# Patient Record
Sex: Male | Born: 1960 | Race: White | Hispanic: No | Marital: Married | Smoking: Former smoker
Health system: Southern US, Community
[De-identification: ages and names within clinical notes are randomized; demographics above are authoritative.]

## PROBLEM LIST (undated history)

## (undated) DIAGNOSIS — K635 Polyp of colon: Secondary | ICD-10-CM

## (undated) DIAGNOSIS — IMO0001 Reserved for inherently not codable concepts without codable children: Secondary | ICD-10-CM

## (undated) HISTORY — DX: Polyp of colon: K63.5

## (undated) HISTORY — PX: COLON SURGERY: SHX602

---

## 2011-05-25 HISTORY — PX: OTHER SURGICAL HISTORY: SHX169

## 2011-06-16 ENCOUNTER — Encounter (INDEPENDENT_AMBULATORY_CARE_PROVIDER_SITE_OTHER): Payer: Self-pay

## 2011-06-17 ENCOUNTER — Encounter (INDEPENDENT_AMBULATORY_CARE_PROVIDER_SITE_OTHER): Payer: Self-pay | Admitting: General Surgery

## 2011-06-17 ENCOUNTER — Ambulatory Visit (INDEPENDENT_AMBULATORY_CARE_PROVIDER_SITE_OTHER): Payer: BC Managed Care – PPO | Admitting: General Surgery

## 2011-06-17 VITALS — BP 120/82 | HR 68 | Temp 98.2°F | Resp 16 | Ht 71.5 in | Wt 196.4 lb

## 2011-06-17 DIAGNOSIS — K6389 Other specified diseases of intestine: Secondary | ICD-10-CM | POA: Insufficient documentation

## 2011-06-17 NOTE — Progress Notes (Signed)
Subjective:   Colon mass  Patient ID: Chris Erickson, male   DOB: 02/18/1961, 51 y.o.   MRN: [id number]  HPI Patient is a 51-year-old male referred  through the courtesy of Dr. Ganem due to a recent abnormal colonoscopy. This was essentially a routine screening colonoscopy due to reaching age 51. He does have a family history of colon cancer in his brother that developed before age 60. On questioning he had been having an occasional small amount of red blood with his bowel movements for some months. He has not had any other change in his bowel habits, abdominal pain or bloating, weight loss, or any constitutional symptoms.  I have reviewed the colonoscopy results. The chief finding was an approximately 5 cm multilobulated polypoid lesion described in the distal transverse colon. This was biopsied but not removed and it was tattooed and above and below. Several other small benign appearing polyps were removed from the proximal descending colon, proximal descending colon and one in the rectum and one in the sigmoid colon. All were retrieved and sent for pathology.   The pathology report is not final but I has a verbal report. 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 However the 2 polyps that were removed from the proximal descending colon showed high-grade dysplasia. The other polyps from the right colon and sigmoid and rectum were benign. I spoke personally with Dr. Ganem who was concerned that the large lesion appeared malignant.  Past Medical History  Diagnosis Date  . Colon polyp    History reviewed. No pertinent past surgical history. Current Outpatient Prescriptions  Medication Sig Dispense Refill  . guaiFENesin (MUCINEX) 600 MG 12 hr tablet Take 1,200 mg by mouth 2 (two) times daily.       No Known Allergies History   Social History  . Marital Status: Married    Spouse Name: N/A    Number of Children: N/A  . Years of Education:  N/A   Occupational History  . Not on file.   Social History Main Topics  . Smoking status: Former Smoker    Quit date: 05/25/1995  . Smokeless tobacco: Not on file  . 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 Drug Use: No  . Sexually Active: Not on file   Other Topics Concern  . Not on file   Social History Narrative  . No narrative on file    Review of Systems  Constitutional: Negative.   HENT: Negative.   Respiratory: Negative.   Cardiovascular: Negative.   Gastrointestinal: Positive for blood in stool. Negative for nausea, vomiting, abdominal pain, diarrhea, constipation and abdominal distention.  Musculoskeletal: Negative.        Objective:   Physical Exam General: Alert, well-developed Caucasian male, in no distress Skin: Warm and dry without rash or infection. HEENT: No palpable masses or thyromegaly. Sclera nonicteric. Pupils equal round and reactive. Oropharynx clear. Lymph nodes: No cervical, supraclavicular, or inguinal nodes palpable. Lungs: Breath sounds clear and equal without increased work of breathing Cardiovascular: Regular rate and rhythm without murmur. No JVD or edema. Peripheral pulses intact. Abdomen: Nondistended. Soft and nontender. No masses palpable. No organomegaly. No palpable hernias. Extremities: No edema or joint swelling or deformity. No chronic venous stasis changes. Neurologic: Alert and fully oriented. Gait normal.     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 I feel that the patient would be best served with a subtotal colectomy to address his current problems and future risk. I have discussed this with Dr. Ganem who agrees that the  patient needs a subtotal colectomy. This was discussed in detail with the patient and his wife. I would plan to approach this laparoscopically. We discussed the nature of the procedure and risks of bleeding, infection, anastomotic leak, possible need for temporary ostomy. We discussed that his bowel movements would be much more frequent and loose. Indications for the surgery reviewed and all their questions were answered and he were given literature.    Plan:     Laparoscopic subtotal colectomy with AM admission and bowel prep at home.      

## 2011-06-17 NOTE — Progress Notes (Signed)
Addended by: Glenna Fellows T on: 06/17/2011 11:30 AM   Modules accepted: Orders

## 2011-06-21 ENCOUNTER — Encounter (HOSPITAL_COMMUNITY): Payer: Self-pay

## 2011-06-22 ENCOUNTER — Telehealth (INDEPENDENT_AMBULATORY_CARE_PROVIDER_SITE_OTHER): Payer: Self-pay

## 2011-06-22 NOTE — Telephone Encounter (Signed)
Alvino Chapel (spouse) called to report that Mr. Duve having a lot of pain since his colonoscopy w/biopsy, she also reports that he has blacked out on two different occasions . She denies any abdominal swelling or bloating, nausea/vomiting, no bloody discharge or bloody stools.  She states he has had a decrease in his appetite.  I strongly advised her to call and report this to Dr. Evette Cristal.

## 2011-06-28 ENCOUNTER — Ambulatory Visit (INDEPENDENT_AMBULATORY_CARE_PROVIDER_SITE_OTHER): Payer: Self-pay | Admitting: General Surgery

## 2011-06-28 ENCOUNTER — Encounter (HOSPITAL_COMMUNITY)
Admission: RE | Admit: 2011-06-28 | Discharge: 2011-06-28 | Disposition: A | Discharge status: Home / Self Care | Payer: BC Managed Care – PPO | Source: Ambulatory Visit | Attending: General Surgery | Admitting: General Surgery

## 2011-06-28 ENCOUNTER — Encounter (HOSPITAL_COMMUNITY): Payer: Self-pay

## 2011-06-28 HISTORY — DX: Polyp of colon: K63.5

## 2011-06-28 HISTORY — DX: Reserved for inherently not codable concepts without codable children: IMO0001

## 2011-06-28 LAB — CBC
Hemoglobin: 14.6 g/dL (ref 13.0–17.0)
MCH: 30.2 pg (ref 26.0–34.0)
MCHC: 34.7 g/dL (ref 30.0–36.0)
RDW: 12.7 % (ref 11.5–15.5)

## 2011-06-28 LAB — SURGICAL PCR SCREEN
MRSA, PCR: NEGATIVE
Staphylococcus aureus: NEGATIVE

## 2011-06-28 NOTE — Patient Instructions (Signed)
20 Chris Erickson  06/28/2011   Your procedure is scheduled on:  07-06-2011  Report to [facility] at 1030 AM.  Call this number if you have problems the morning of surgery: [phone]   Remember:follow bowel pre instructions from dr Johna Sheriff   Do not eat food or drink liquids:After Midnight.      Take these medicines the morning of surgery with A SIP OF WATER: no meds to take   Do not wear jewelry..  Do not wear lotions, powders, or perfumes Do not wear deodorant.    Do not bring valuables to the hospital.  Contacts, dentures or bridgework may not be worn into surgery.  Leave suitcase in the car. After surgery it may be brought to your room.  For patients admitted to the hospital, checkout time is 11:00 AM the day of discharge.   special Instructions: CHG Shower Use Special Wash: 1/2 bottle night before surgery and 1/2 bottle morning of surgery.neck down avoid private area   Please read over the following fact sheets that you were given: MRSA Information, blood fact sheet Jasmine December Eaton Folmar rn [facility] pre op nurse phone number [phone]

## 2011-06-28 NOTE — Pre-Procedure Instructions (Signed)
ekg  05-20-2011 dr fried eagle on chart

## 2011-07-06 ENCOUNTER — Inpatient Hospital Stay (HOSPITAL_COMMUNITY)
Admission: RE | Admit: 2011-07-06 | Discharge: 2011-07-11 | DRG: 148 | Disposition: A | Discharge status: Home / Self Care | Payer: BC Managed Care – PPO | Source: Ambulatory Visit | Attending: General Surgery | Admitting: General Surgery

## 2011-07-06 ENCOUNTER — Other Ambulatory Visit (INDEPENDENT_AMBULATORY_CARE_PROVIDER_SITE_OTHER): Payer: Self-pay | Admitting: General Surgery

## 2011-07-06 ENCOUNTER — Encounter (HOSPITAL_COMMUNITY): Payer: Self-pay | Admitting: *Deleted

## 2011-07-06 ENCOUNTER — Inpatient Hospital Stay (HOSPITAL_COMMUNITY): Payer: BC Managed Care – PPO | Admitting: *Deleted

## 2011-07-06 ENCOUNTER — Encounter (INDEPENDENT_AMBULATORY_CARE_PROVIDER_SITE_OTHER): Payer: Self-pay

## 2011-07-06 ENCOUNTER — Encounter (HOSPITAL_COMMUNITY)
Admission: RE | Disposition: A | Discharge status: Home / Self Care | Payer: Self-pay | Source: Ambulatory Visit | Attending: General Surgery

## 2011-07-06 DIAGNOSIS — C189 Malignant neoplasm of colon, unspecified: Secondary | ICD-10-CM

## 2011-07-06 DIAGNOSIS — Z800 Family history of malignant neoplasm of digestive organs: Secondary | ICD-10-CM

## 2011-07-06 DIAGNOSIS — D126 Benign neoplasm of colon, unspecified: Secondary | ICD-10-CM | POA: Diagnosis present

## 2011-07-06 DIAGNOSIS — C184 Malignant neoplasm of transverse colon: Principal | ICD-10-CM | POA: Clinically undetermined

## 2011-07-06 DIAGNOSIS — K560 Paralytic ileus: Secondary | ICD-10-CM | POA: Diagnosis not present

## 2011-07-06 DIAGNOSIS — Z87891 Personal history of nicotine dependence: Secondary | ICD-10-CM

## 2011-07-06 LAB — TYPE AND SCREEN
ABO/RH(D): A POS
Antibody Screen: NEGATIVE

## 2011-07-06 LAB — CBC
HCT: 38.6 % — ABNORMAL LOW (ref 39.0–52.0)
Hemoglobin: 13.3 g/dL (ref 13.0–17.0)
MCH: 29.9 pg (ref 26.0–34.0)
RBC: 4.45 MIL/uL (ref 4.22–5.81)

## 2011-07-06 SURGERY — LAPAROSCOPIC PARTIAL COLECTOMY
Anesthesia: General | Site: Abdomen | Wound class: Clean Contaminated

## 2011-07-06 MED ORDER — KCL IN DEXTROSE-NACL 20-5-0.9 MEQ/L-%-% IV SOLN
INTRAVENOUS | Status: DC
  Administered 2011-07-06 – 2011-07-08 (×5): via INTRAVENOUS
  Administered 2011-07-09: 1000 mL via INTRAVENOUS
  Administered 2011-07-09: 20:00:00 via INTRAVENOUS
  Filled 2011-07-06 (×12): qty 1000

## 2011-07-06 MED ORDER — HYDROMORPHONE HCL PF 1 MG/ML IJ SOLN
0.2500 mg | INTRAMUSCULAR | Status: DC | PRN
  Administered 2011-07-06 (×3): 0.25 mg via INTRAVENOUS

## 2011-07-06 MED ORDER — PROPOFOL 10 MG/ML IV EMUL
INTRAVENOUS | Status: DC | PRN
  Administered 2011-07-06: 150 mg via INTRAVENOUS

## 2011-07-06 MED ORDER — ONDANSETRON HCL 4 MG PO TABS: 4.0000 mg | ORAL_TABLET | Freq: Four times a day (QID) | ORAL | Status: DC | PRN

## 2011-07-06 MED ORDER — LIDOCAINE HCL (CARDIAC) 20 MG/ML IV SOLN
INTRAVENOUS | Status: DC | PRN
  Administered 2011-07-06: 100 mg via INTRAVENOUS

## 2011-07-06 MED ORDER — FENTANYL CITRATE 0.05 MG/ML IJ SOLN
INTRAMUSCULAR | Status: DC | PRN
  Administered 2011-07-06: 150 ug via INTRAVENOUS
  Administered 2011-07-06 (×2): 50 ug via INTRAVENOUS

## 2011-07-06 MED ORDER — SODIUM CHLORIDE 0.9 % IV SOLN
INTRAVENOUS | Status: AC
  Filled 2011-07-06: qty 1

## 2011-07-06 MED ORDER — OXYCODONE-ACETAMINOPHEN 5-325 MG PO TABS
1.0000 | ORAL_TABLET | ORAL | Status: DC | PRN
Start: 2011-07-06 — End: 2011-07-11

## 2011-07-06 MED ORDER — NEOSTIGMINE METHYLSULFATE 1 MG/ML IJ SOLN
INTRAMUSCULAR | Status: DC | PRN
  Administered 2011-07-06: 5 mg via INTRAVENOUS

## 2011-07-06 MED ORDER — LACTATED RINGERS IV SOLN
INTRAVENOUS | Status: DC
  Administered 2011-07-06: 20:00:00 via INTRAVENOUS
  Administered 2011-07-06: 1000 mL via INTRAVENOUS

## 2011-07-06 MED ORDER — ONDANSETRON HCL 4 MG/2ML IJ SOLN
INTRAMUSCULAR | Status: DC | PRN
  Administered 2011-07-06: 4 mg via INTRAVENOUS

## 2011-07-06 MED ORDER — ACETAMINOPHEN 10 MG/ML IV SOLN
INTRAVENOUS | Status: DC | PRN
  Administered 2011-07-06: 1000 mg via INTRAVENOUS

## 2011-07-06 MED ORDER — ONDANSETRON HCL 4 MG/2ML IJ SOLN
4.0000 mg | Freq: Four times a day (QID) | INTRAMUSCULAR | Status: DC | PRN
  Administered 2011-07-07 – 2011-07-09 (×2): 4 mg via INTRAVENOUS
  Filled 2011-07-06 (×2): qty 2

## 2011-07-06 MED ORDER — HYDROMORPHONE HCL PF 1 MG/ML IJ SOLN
INTRAMUSCULAR | Status: DC | PRN
  Administered 2011-07-06 (×4): 0.5 mg via INTRAVENOUS

## 2011-07-06 MED ORDER — LACTATED RINGERS IR SOLN
Status: DC | PRN
  Administered 2011-07-06: 1000 mL

## 2011-07-06 MED ORDER — EPHEDRINE SULFATE 50 MG/ML IJ SOLN
INTRAMUSCULAR | Status: DC | PRN
  Administered 2011-07-06: 5 mg via INTRAVENOUS

## 2011-07-06 MED ORDER — MIDAZOLAM HCL 5 MG/5ML IJ SOLN
INTRAMUSCULAR | Status: DC | PRN
  Administered 2011-07-06: 2 mg via INTRAVENOUS

## 2011-07-06 MED ORDER — ALVIMOPAN 12 MG PO CAPS: 12.0000 mg | ORAL_CAPSULE | ORAL | Status: DC

## 2011-07-06 MED ORDER — BUPIVACAINE LIPOSOME 1.3 % IJ SUSP
INTRAMUSCULAR | Status: DC | PRN
  Administered 2011-07-06: 20 mL

## 2011-07-06 MED ORDER — PROMETHAZINE HCL 25 MG/ML IJ SOLN: 6.2500 mg | INTRAMUSCULAR | Status: DC | PRN

## 2011-07-06 MED ORDER — HYDROMORPHONE HCL PF 1 MG/ML IJ SOLN
INTRAMUSCULAR | Status: AC
  Filled 2011-07-06: qty 1

## 2011-07-06 MED ORDER — ACETAMINOPHEN 10 MG/ML IV SOLN
INTRAVENOUS | Status: AC
  Filled 2011-07-06: qty 100

## 2011-07-06 MED ORDER — GLYCOPYRROLATE 0.2 MG/ML IJ SOLN
INTRAMUSCULAR | Status: DC | PRN
  Administered 2011-07-06: .6 mg via INTRAVENOUS
  Administered 2011-07-06: 0.2 mg via INTRAVENOUS

## 2011-07-06 MED ORDER — ALVIMOPAN 12 MG PO CAPS
12.0000 mg | ORAL_CAPSULE | Freq: Two times a day (BID) | ORAL | Status: DC
Start: 2011-07-07 — End: 2011-07-11
  Administered 2011-07-07 – 2011-07-10 (×8): 12 mg via ORAL
  Filled 2011-07-06 (×10): qty 1

## 2011-07-06 MED ORDER — MORPHINE SULFATE 2 MG/ML IJ SOLN
2.0000 mg | INTRAMUSCULAR | Status: DC | PRN
  Administered 2011-07-06 (×2): 2 mg via INTRAVENOUS
  Administered 2011-07-07: 4 mg via INTRAVENOUS
  Administered 2011-07-07: 6 mg via INTRAVENOUS
  Administered 2011-07-07 (×4): 4 mg via INTRAVENOUS
  Administered 2011-07-07 (×2): 2 mg via INTRAVENOUS
  Administered 2011-07-07 – 2011-07-08 (×12): 4 mg via INTRAVENOUS
  Filled 2011-07-06: qty 1
  Filled 2011-07-06 (×6): qty 2
  Filled 2011-07-06 (×2): qty 1
  Filled 2011-07-06 (×5): qty 2
  Filled 2011-07-06: qty 1
  Filled 2011-07-06 (×5): qty 2
  Filled 2011-07-06: qty 1
  Filled 2011-07-06: qty 2
  Filled 2011-07-06: qty 3
  Filled 2011-07-06: qty 2

## 2011-07-06 MED ORDER — HEPARIN SODIUM (PORCINE) 5000 UNIT/ML IJ SOLN
5000.0000 [IU] | Freq: Three times a day (TID) | INTRAMUSCULAR | Status: DC
  Administered 2011-07-06 – 2011-07-09 (×10): 5000 [IU] via SUBCUTANEOUS
  Filled 2011-07-06 (×17): qty 1

## 2011-07-06 MED ORDER — LABETALOL HCL 5 MG/ML IV SOLN
INTRAVENOUS | Status: DC | PRN
  Administered 2011-07-06: 2.5 mg via INTRAVENOUS

## 2011-07-06 MED ORDER — ROCURONIUM BROMIDE 100 MG/10ML IV SOLN
INTRAVENOUS | Status: DC | PRN
  Administered 2011-07-06 (×2): 10 mg via INTRAVENOUS
  Administered 2011-07-06: 5 mg via INTRAVENOUS
  Administered 2011-07-06: 50 mg via INTRAVENOUS
  Administered 2011-07-06 (×3): 10 mg via INTRAVENOUS

## 2011-07-06 MED ORDER — SODIUM CHLORIDE 0.9 % IV SOLN
1.0000 g | INTRAVENOUS | Status: AC
  Administered 2011-07-06: 1 g via INTRAVENOUS

## 2011-07-06 MED ORDER — BUPIVACAINE LIPOSOME 1.3 % IJ SUSP
20.0000 mL | Freq: Once | INTRAMUSCULAR | Status: DC
  Filled 2011-07-06 (×2): qty 20

## 2011-07-06 SURGICAL SUPPLY — 79 items
APPLIER CLIP 5 13 M/L LIGAMAX5 (MISCELLANEOUS) ×2
APPLIER CLIP ROT 10 11.4 M/L (STAPLE) ×2
BAG URINE DRAINAGE (UROLOGICAL SUPPLIES) IMPLANT
BAG URO CATCHER STRL LF (DRAPE) IMPLANT
BLADE EXTENDED COATED 6.5IN (ELECTRODE) ×2 IMPLANT
BLADE HEX COATED 2.75 (ELECTRODE) ×2 IMPLANT
BLADE SURG SZ10 CARB STEEL (BLADE) ×2 IMPLANT
CABLE HIGH FREQUENCY MONO STRZ (ELECTRODE) ×2 IMPLANT
CANISTER SUCTION 2500CC (MISCELLANEOUS) ×2 IMPLANT
CATH FOLEY SILVER 30CC 28FR (CATHETERS) IMPLANT
CELLS DAT CNTRL 66122 CELL SVR (MISCELLANEOUS) ×1 IMPLANT
CLIP APPLIE 5 13 M/L LIGAMAX5 (MISCELLANEOUS) ×1 IMPLANT
CLIP APPLIE ROT 10 11.4 M/L (STAPLE) ×1 IMPLANT
CLOTH BEACON ORANGE TIMEOUT ST (SAFETY) ×2 IMPLANT
COVER MAYO STAND STRL (DRAPES) ×2 IMPLANT
DECANTER SPIKE VIAL GLASS SM (MISCELLANEOUS) IMPLANT
DERMABOND ADVANCED (GAUZE/BANDAGES/DRESSINGS) ×1
DERMABOND ADVANCED .7 DNX12 (GAUZE/BANDAGES/DRESSINGS) ×1 IMPLANT
DRAIN CHANNEL 19F RND (DRAIN) IMPLANT
DRAPE CAMERA CLOSED 9X96 (DRAPES) ×2 IMPLANT
DRAPE LAPAROSCOPIC ABDOMINAL (DRAPES) ×2 IMPLANT
DRAPE LG THREE QUARTER DISP (DRAPES) ×2 IMPLANT
DRAPE WARM FLUID 44X44 (DRAPE) ×2 IMPLANT
ELECT REM PT RETURN 9FT ADLT (ELECTROSURGICAL) ×2
ELECTRODE REM PT RTRN 9FT ADLT (ELECTROSURGICAL) ×1 IMPLANT
FILTER SMOKE EVAC LAPAROSHD (FILTER) IMPLANT
GLOVE BIOGEL PI IND STRL 7.0 (GLOVE) ×1 IMPLANT
GLOVE BIOGEL PI INDICATOR 7.0 (GLOVE) ×1
GLOVE ECLIPSE 8.0 STRL XLNG CF (GLOVE) ×4 IMPLANT
GLOVE INDICATOR 8.0 STRL GRN (GLOVE) ×2 IMPLANT
GOWN STRL NON-REIN LRG LVL3 (GOWN DISPOSABLE) ×2 IMPLANT
GOWN STRL REIN XL XLG (GOWN DISPOSABLE) ×4 IMPLANT
GRASPER LAPSCPC 5X35 EPIX (ENDOMECHANICALS) IMPLANT
HAND ACTIVATED (MISCELLANEOUS) IMPLANT
KIT BASIN OR (CUSTOM PROCEDURE TRAY) ×2 IMPLANT
LEGGING LITHOTOMY PAIR STRL (DRAPES) ×2 IMPLANT
LIGASURE IMPACT 36 18CM CVD LR (INSTRUMENTS) IMPLANT
NS IRRIG 1000ML POUR BTL (IV SOLUTION) ×4 IMPLANT
PACK GENERAL/GYN (CUSTOM PROCEDURE TRAY) ×2 IMPLANT
PENCIL BUTTON HOLSTER BLD 10FT (ELECTRODE) ×2 IMPLANT
PUMP PAIN ON-Q (MISCELLANEOUS) ×2 IMPLANT
RTRCTR WOUND ALEXIS 18CM MED (MISCELLANEOUS) ×2
SCISSORS LAP 5X35 DISP (ENDOMECHANICALS) ×2 IMPLANT
SEALER TISSUE G2 CVD JAW 35 (ENDOMECHANICALS) IMPLANT
SEALER TISSUE G2 CVD JAW 45CM (ENDOMECHANICALS)
SET IRRIG TUBING LAPAROSCOPIC (IRRIGATION / IRRIGATOR) ×2 IMPLANT
SLEEVE Z-THREAD 5X100MM (TROCAR) ×6 IMPLANT
SOLUTION ANTI FOG 6CC (MISCELLANEOUS) ×2 IMPLANT
SPONGE GAUZE 4X4 12PLY (GAUZE/BANDAGES/DRESSINGS) ×2 IMPLANT
SPONGE LAP 18X18 X RAY DECT (DISPOSABLE) ×2 IMPLANT
STAPLER VISISTAT 35W (STAPLE) IMPLANT
SUCTION POOLE TIP (SUCTIONS) ×2 IMPLANT
SUT MNCRL AB 4-0 PS2 18 (SUTURE) ×2 IMPLANT
SUT PDS AB 1 CT1 27 (SUTURE) ×4 IMPLANT
SUT PDS AB 1 CTX 36 (SUTURE) ×4 IMPLANT
SUT PDS AB 1 TP1 96 (SUTURE) IMPLANT
SUT PROLENE 2 0 KS (SUTURE) IMPLANT
SUT SILK 2 0 (SUTURE) ×2
SUT SILK 2 0 SH CR/8 (SUTURE) ×8 IMPLANT
SUT SILK 2-0 18XBRD TIE 12 (SUTURE) ×2 IMPLANT
SUT SILK 3 0 (SUTURE) ×1
SUT SILK 3 0 SH CR/8 (SUTURE) ×2 IMPLANT
SUT SILK 3-0 18XBRD TIE 12 (SUTURE) ×1 IMPLANT
SUT VIC AB 2-0 SH 18 (SUTURE) IMPLANT
SUT VICRYL 2 0 18  UND BR (SUTURE) ×2
SUT VICRYL 2 0 18 UND BR (SUTURE) ×2 IMPLANT
SYR 30ML LL (SYRINGE) IMPLANT
SYRINGE IRR TOOMEY STRL 70CC (SYRINGE) IMPLANT
SYS LAPSCP GELPORT 120MM (MISCELLANEOUS)
SYSTEM LAPSCP GELPORT 120MM (MISCELLANEOUS) IMPLANT
TOWEL OR 17X26 10 PK STRL BLUE (TOWEL DISPOSABLE) ×4 IMPLANT
TRAY FOLEY CATH 14FRSI W/METER (CATHETERS) ×2 IMPLANT
TRAY LAP CHOLE (CUSTOM PROCEDURE TRAY) ×2 IMPLANT
TROCAR Z-THREAD FIOS 11X100 BL (TROCAR) ×2 IMPLANT
TROCAR Z-THREAD FIOS 5X100MM (TROCAR) ×4 IMPLANT
TROCAR Z-THREAD SLEEVE 11X100 (TROCAR) IMPLANT
TUBING FILTER THERMOFLATOR (ELECTROSURGICAL) ×2 IMPLANT
YANKAUER SUCT BULB TIP 10FT TU (MISCELLANEOUS) ×2 IMPLANT
YANKAUER SUCT BULB TIP NO VENT (SUCTIONS) ×2 IMPLANT

## 2011-07-06 NOTE — Anesthesia Postprocedure Evaluation (Signed)
  Anesthesia Post-op Note  Patient: Chris Erickson  Procedure(s) Performed: Procedure(s) (LRB): LAPAROSCOPIC PARTIAL COLECTOMY (N/A)  Patient Location: PACU  Anesthesia Type: General  Level of Consciousness: awake and alert   Airway and Oxygen Therapy: Patient Spontanous Breathing  Post-op Pain: mild  Post-op Assessment: Post-op Vital signs reviewed, Patient's Cardiovascular Status Stable, Respiratory Function Stable, Patent Airway and No signs of Nausea or vomiting  Post-op Vital Signs: stable  Complications: No apparent anesthesia complications

## 2011-07-06 NOTE — Anesthesia Preprocedure Evaluation (Signed)
Anesthesia Evaluation  Patient identified by MRN, date of birth, ID band Patient awake    Reviewed: Allergy & Precautions, H&P , NPO status , Patient's Chart, lab work & pertinent test results  Airway Mallampati: II TM Distance: >3 FB Neck ROM: Full    Dental No notable dental hx.    Pulmonary former smoker clear to auscultation  Pulmonary exam normal       Cardiovascular neg cardio ROS Regular Normal    Neuro/Psych Negative Neurological ROS  Negative Psych ROS   GI/Hepatic negative GI ROS, Neg liver ROS,   Endo/Other  Negative Endocrine ROS  Renal/GU negative Renal ROS  Genitourinary negative   Musculoskeletal negative musculoskeletal ROS (+)   Abdominal   Peds negative pediatric ROS (+)  Hematology negative hematology ROS (+)   Anesthesia Other Findings   Reproductive/Obstetrics negative OB ROS                           Anesthesia Physical Anesthesia Plan  ASA: II  Anesthesia Plan: General   Post-op Pain Management:    Induction: Intravenous  Airway Management Planned: Oral ETT  Additional Equipment:   Intra-op Plan:   Post-operative Plan: Extubation in OR  Informed Consent: I have reviewed the patients History and Physical, chart, labs and discussed the procedure including the risks, benefits and alternatives for the proposed anesthesia with the patient or authorized representative who has indicated his/her understanding and acceptance.   Dental advisory given  Plan Discussed with: CRNA  Anesthesia Plan Comments:         Anesthesia Quick Evaluation

## 2011-07-06 NOTE — Interval H&P Note (Signed)
History and Physical Interval Note:  07/06/2011 2:14 PM  Chris Erickson  has presented today for surgery, with the diagnosis of multiple colon polyps  The various methods of treatment have been discussed with the patient and family. After consideration of risks, benefits and other options for treatment, the patient has consented to  Procedure(s) (LRB): LAPAROSCOPIC PARTIAL COLECTOMY (N/A) as a surgical intervention .  The patients' history has been reviewed, patient examined, no change in status, stable for surgery.  I have reviewed the patients' chart and labs.  Questions were answered to the patient's satisfaction.     Meka Lewan T

## 2011-07-06 NOTE — Op Note (Signed)
Preoperative Diagnosis: multiple colon polyps  Postoprative Diagnosis: multiple colon polyps  Procedure: Procedure(s): LAPAROSCOPIC SUBTOTAL COLECTOMY   Surgeon: Glenna Fellows T   Assistants: Harriette Bouillon and Manus Rudd  Anesthesia:  General endotracheal anesthesiaDiagnos  Indications:  Patient is a 51 year old male with a history of a brother with colon cancer in his 37s who recently underwent initial screening colonoscopy. This was significant for a 5 cm suspicious-appearing polypoid mass in the distal transverse colon with biopsy showing villous adenoma and some atypia. Also removed were 2 small polyps in the proximal descending colon which revealed severe atypia. Several other benign polyps without atypia removed from the right colon and 2 from the sigmoid and rectum. With this constellation of findings we have discussed the surgical options and elected to proceed with laparoscopic subtotal colectomy with ileum to distal sigmoid anastomosis. We discussed the indications for the procedure and risks of anesthetic complications, bleeding, infection, anastomotic leak and possible temporary ostomy. He is now brought to the operating room for this procedure. He underwent a mechanical bowel prep at home.   Procedure Detail:  Patient is brought to the operating room, placed in the supine position on the operating table on a gel mat and general endotracheal anesthesia induced. He was carefully positioned in lithotomy position on yellowfin stirrups well-padded with arms tucked and his thighs were extended at the hips. Foley catheter was placed. He received broad-spectrum preoperative IV antibiotics. Patient time out was performed and correct procedure verified after widely sterilely prepping and draping the abdomen and perineum. Access was obtained with an open Hassan technique through a 1 cm incision beneath the umbilicus and pneumoperitoneum established. 5 mm trochars were placed in the  right and left lower quadrant just above and medial to the anterior superior iliac spines and in the right and left upper quadrant laterally Laparoscopic exploration revealed normal small bowel colon and liver. Tattooing was evident in the distal transverse colon. The patient was placed in steep reverse Trendelenburg and the base of the mesentery of the terminal ileum and right colon exposed medially. The peritoneum was incised just anterior to the iliac vessels and careful blunt dissection was carried into the retroperitoneum behind the mesentery of the right colon and terminal ileum. The iliac vessels were protected. The duodenum was identified and protected and dissected posteriorly. This dissection was carried up toward the transverse mesocolon and lateral attachments of the right colon and hepatic flexure. Following this lateral peritoneal attachments of the terminal ileum cecum and right colon were taken down with harmonic scalpel. This dissection was then carried around the hepatic flexure dividing the hepatocolic ligament and completely mobilizing the hepatic flexure. With the patient in reverse Trendelenburg the omentum was elevated and the transverse colon which was quite redundant was mobilized dividing along the avascular area of the omental attachment and then as we progressed distally the lesser sac was entered and the transverse colon fully mobilized over toward the splenic flexure. With the colon elevated I then came through the mesentery of the transverse colon with the harmonic scalpel working back toward the right colon. The ileocolic vascular pedicle was also divided holding the cecum anteriorly and tenting up the pedicle, creating a window around it and this was divided with the harmonic scalpel additionally ligated with an Endoloop suture. Attention was then turned to the rectosigmoid with the patient in again Trendelenburg the medial aspect of the peritoneum of the mesentery was incised above  the iliac vessels and careful blunt dissection carried out  laterally under the mesentery the sigmoid colon. I had some difficulty identifying a clear plane here and therefore went laterally dividing lateral peritoneal attachments of the rectosigmoid and sigmoid and dissecting both lateral to medial and medial to lateral the left ureter was clearly identified along its course and protected in the sigmoid and rectosigmoid fully mobilized. I elected to resect at the distal sigmoid colon which was very mobile and could come up to the umbilical incision without difficulty. We then worked superiorly along the left colon fully mobilizing the left colon medially and then came around the splenic flexure and completely mobilized the splenic flexure down toward the umbilicus appeared at this point I grasped the paracolic fat at the distal sigmoid colon at the planned area of resection with a locking grasper and the small infraumbilical incision was extended to about 5 cm in length and a wound protector placed. The sigmoid colon was brought out easily to this incision and it was cleaned the pericolic fat and mesentery and divided between [facility] and Allen clamps. I then sequentially delivered the more proximal sigmoid and left colon through the incision and divided the mesentery clamping proximally and tying and dividing distally with the harmonic scalpel. As we came up toward the splenic flexure we came to the area where I had divided the mesentery and the entire transverse colon and proximal right colon were brought out easily through the incision. I then was able to deliver the right colon and the mesentery was divided in a similar fashion. Following this the cecum and terminal ileum could be easily brought out through the small incision. The terminal ileum was divided between Coker clamps being careful to keep the mesentery oriented properly and the specimen was removed. Following this an end-to-end anastomosis was created  between the terminal ileum and sigmoid colon with full-thickness interrupting inverted 2-0 silk sutures. The bowel had very good blood supply and there was no tension and the anastomosis appeared widely patent. Following this the bowel was replaced into the abdomen and laparoscopy confirmed orientation of the mesentery was correct and all the small bowel was run in the left upper quadrant down to the anastomosis. There was no evidence of bleeding. The abdomen was irrigated. CO2 was evacuated the trochars removed. Exparel local anesthetic was infiltrated in the incision in the midline and the preperitoneal space. The midline fascia was closed with running #1 PDS beginning at either of the incision and tied centrally. Sponge needle and instrument counts were correct. Skin was closed with subcuticular Monocryl and Dermabond  Findings: No gross evidence of tumor  Estimated Blood Loss:  200 mL         Drains: none  Blood Given: none          Specimens: terminal ileum and abdominal colon        Complications:  * No complications entered in OR log *         Disposition: PACU - hemodynamically stable.         Condition: stable  Mariella Saa MD, FACS  07/06/2011, 8:51 PM

## 2011-07-06 NOTE — Preoperative (Signed)
Beta Blockers   Reason not to administer Beta Blockers:Not Applicable 

## 2011-07-06 NOTE — Transfer of Care (Signed)
Immediate Anesthesia Transfer of Care Note  Patient: Chris Erickson  Procedure(s) Performed: Procedure(s) (LRB): LAPAROSCOPIC PARTIAL COLECTOMY (N/A)  Patient Location: PACU  Anesthesia Type: General  Level of Consciousness: sedated  Airway & Oxygen Therapy: Patient Spontanous Breathing and Patient connected to face mask oxygen  Post-op Assessment: Report given to PACU RN and Post -op Vital signs reviewed and stable  Post vital signs: Reviewed and stable  Complications: No apparent anesthesia complications

## 2011-07-06 NOTE — Progress Notes (Signed)
Completed bowel prep as directed by doctor according to patient

## 2011-07-06 NOTE — H&P (View-Only) (Signed)
Subjective:   Colon mass  Patient ID: Chris Erickson, male   DOB: Nov 15, 1960, 51 y.o.   MRN: [id number]  HPI Patient is a 51 year old male referred  through the courtesy of Dr. Evette Cristal due to a recent abnormal colonoscopy. This was essentially a routine screening colonoscopy due to reaching age 12. He does have a family history of colon cancer in his brother that developed before age 19. On questioning he had been having an occasional small amount of red blood with his bowel movements for some months. He has not had any other change in his bowel habits, abdominal pain or bloating, weight loss, or any constitutional symptoms.  I have reviewed the colonoscopy results. The chief finding was an approximately 5 cm multilobulated polypoid lesion described in the distal transverse colon. This was biopsied but not removed and it was tattooed and above and below. Several other small benign appearing polyps were removed from the proximal descending colon, proximal descending colon and one in the rectum and one in the sigmoid colon. All were retrieved and sent for pathology.   The pathology report is not final but I has a verbal report. 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 However the 2 polyps that were removed from the proximal descending colon showed high-grade dysplasia. The other polyps from the right colon and sigmoid and rectum were benign. I spoke personally with Dr. Evette Cristal who was concerned that the large lesion appeared malignant.  Past Medical History  Diagnosis Date  . Colon polyp    History reviewed. No pertinent past surgical history. Current Outpatient Prescriptions  Medication Sig Dispense Refill  . guaiFENesin (MUCINEX) 600 MG 12 hr tablet Take 1,200 mg by mouth 2 (two) times daily.       No Known Allergies History   Social History  . Marital Status: Married    Spouse Name: N/A    Number of Children: N/A  . Years of Education:  N/A   Occupational History  . Not on file.   Social History Main Topics  . Smoking status: Former Smoker    Quit date: 05/25/1995  . Smokeless tobacco: Not on file  . 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 Drug Use: No  . Sexually Active: Not on file   Other Topics Concern  . Not on file   Social History Narrative  . No narrative on file    Review of Systems  Constitutional: Negative.   HENT: Negative.   Respiratory: Negative.   Cardiovascular: Negative.   Gastrointestinal: Positive for blood in stool. Negative for nausea, vomiting, abdominal pain, diarrhea, constipation and abdominal distention.  Musculoskeletal: Negative.        Objective:   Physical Exam General: Alert, well-developed Caucasian male, in no distress Skin: Warm and dry without rash or infection. HEENT: No palpable masses or thyromegaly. Sclera nonicteric. Pupils equal round and reactive. Oropharynx clear. Lymph nodes: No cervical, supraclavicular, or inguinal nodes palpable. Lungs: Breath sounds clear and equal without increased work of breathing Cardiovascular: Regular rate and rhythm without murmur. No JVD or edema. Peripheral pulses intact. Abdomen: Nondistended. Soft and nontender. No masses palpable. No organomegaly. No palpable hernias. Extremities: No edema or joint swelling or deformity. No chronic venous stasis changes. Neurologic: Alert and fully oriented. Gait normal.     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 I feel that the patient would be best served with a subtotal colectomy to address his current problems and future risk. I have discussed this with Dr. Evette Cristal who agrees that the  patient needs a subtotal colectomy. This was discussed in detail with the patient and his wife. I would plan to approach this laparoscopically. We discussed the nature of the procedure and risks of bleeding, infection, anastomotic leak, possible need for temporary ostomy. We discussed that his bowel movements would be much more frequent and loose. Indications for the surgery reviewed and all their questions were answered and he were given literature.    Plan:     Laparoscopic subtotal colectomy with AM admission and bowel prep at home.

## 2011-07-07 LAB — BASIC METABOLIC PANEL
BUN: 8 mg/dL (ref 6–23)
CO2: 24 mEq/L (ref 19–32)
Glucose, Bld: 153 mg/dL — ABNORMAL HIGH (ref 70–99)
Potassium: 3.8 mEq/L (ref 3.5–5.1)
Sodium: 134 mEq/L — ABNORMAL LOW (ref 135–145)

## 2011-07-07 LAB — CBC
Hemoglobin: 12.9 g/dL — ABNORMAL LOW (ref 13.0–17.0)
MCH: 29.4 pg (ref 26.0–34.0)
MCHC: 33.6 g/dL (ref 30.0–36.0)
MCV: 87.5 fL (ref 78.0–100.0)
RBC: 4.39 MIL/uL (ref 4.22–5.81)

## 2011-07-07 MED ORDER — SIMETHICONE 80 MG PO CHEW
80.0000 mg | CHEWABLE_TABLET | Freq: Four times a day (QID) | ORAL | Status: DC | PRN
  Administered 2011-07-07 – 2011-07-09 (×4): 80 mg via ORAL
  Filled 2011-07-07 (×6): qty 1

## 2011-07-07 MED ORDER — ZOLPIDEM TARTRATE 10 MG PO TABS
10.0000 mg | ORAL_TABLET | Freq: Every evening | ORAL | Status: DC | PRN
  Administered 2011-07-07 – 2011-07-09 (×2): 10 mg via ORAL
  Filled 2011-07-07 (×2): qty 1

## 2011-07-07 NOTE — Progress Notes (Signed)
Pt requests to leave foley in at this time, will reassess later this am.

## 2011-07-07 NOTE — Progress Notes (Signed)
Patient is still refusing to get rid of his foley.  Will Marlyne Beards, PA-C notified.  He said that it was OK to keep the foley in if the patient refused to let us take it out.  Will continue to try to control that patient's pain to a point that he will be more likely to ambulate.

## 2011-07-07 NOTE — Progress Notes (Signed)
Patient refuses to let RN remove foley.  Patient reports that he wants to wait until he can move about before removing the foley.  RN agreed to allow the patient until lunchtime, but patient still refuses to get out of bed and to let the foley be DC'ed.  RN discussed the benefits of early ambulation with the patient and spouse.  RN told the patient that he could have pain meds Q1 hour, but the patient gets a dose of medication and then goes to sleep.  He is medicated upon awakening from sleep.  Will continue to encourage ambulation and removal of the foley.

## 2011-07-07 NOTE — Progress Notes (Signed)
Patient and his spouse have asked that we leave them alone due to a lack of rest/relaxation.  RN explained to patient and family why we hourly round and that we want to best suit their needs.  It was agreed upon by the patient and his spouse to place a sign on the door for staff not to enter.  They report that they will call for [place] when they are needed.  They report that they appreciate our help, but that rest is the best thing for him at this time.  Chasity, AD aware.

## 2011-07-07 NOTE — Progress Notes (Signed)
Patient ID: Chris Erickson, male   DOB: 1961/04/22, 51 y.o.   MRN: [id number] 1 Day Post-Op  Subjective: Moderate incisional pain, mild nausea  Objective: Vital signs in last 24 hours: Temp:  [96.9 F (36.1 C)-98.4 F (36.9 C)] 98.4 F (36.9 C) (02/13 0519) Pulse Rate:  [52-76] 74  (02/13 0519) Resp:  [12-20] 17  (02/13 0519) BP: (123-145)/(70-92) 123/70 mmHg (02/13 0519) SpO2:  [97 %-100 %] 97 % (02/13 0519) Weight:  [187 lb 6.3 oz (85 kg)] 187 lb 6.3 oz (85 kg) (02/12 2154) Last BM Date: 07/05/11  Intake/Output from previous day: 02/12 0701 - 02/13 0700 In: 5460.4 [I.V.:5460.4] Out: 725 [Urine:575; Blood:150] Intake/Output this shift:    General appearance: alert and mild distress GI: abnormal findings:  mild tenderness in the entire abdomen Incision/Wound:Clean and dry  Lab Results:   Basename 07/07/11 0455 07/06/11 2300  WBC 13.0* 16.3*  HGB 12.9* 13.3  HCT 38.4* 38.6*  PLT 269 256   BMET  Basename 07/07/11 0455  NA 134*  K 3.8  CL 103  CO2 24  GLUCOSE 153*  BUN 8  CREATININE 0.88  CALCIUM 8.3*   PT/INR No results found for this basename: LABPROT:2,INR:2 in the last 72 hours ABG No results found for this basename: PHART:2,PCO2:2,PO2:2,HCO3:2 in the last 72 hours  Studies/Results: No results found.  Anti-infectives: Anti-infectives     Start     Dose/Rate Route Frequency Ordered Stop   07/06/11 1045   ertapenem (INVANZ) 1 g in sodium chloride 0.9 % 50 mL IVPB        1 g 100 mL/hr over 30 Minutes Intravenous 60 min pre-op 07/06/11 1038 07/06/11 1440          Assessment/Plan: s/p Procedure(s): LAPAROSCOPIC PARTIAL COLECTOMY Stable post op Cont ice only as some nausea OOB   LOS: 1 day    Ila Jon T 07/07/2011

## 2011-07-08 LAB — CBC
HCT: 35.9 % — ABNORMAL LOW (ref 39.0–52.0)
MCH: 29.7 pg (ref 26.0–34.0)
MCHC: 33.7 g/dL (ref 30.0–36.0)
MCV: 88.2 fL (ref 78.0–100.0)
RDW: 12.8 % (ref 11.5–15.5)

## 2011-07-08 NOTE — Progress Notes (Signed)
Patient ID: NISHAAN STANKE, male   DOB: 04-22-1961, 51 y.o.   MRN: [id number] 2 Days Post-Op  Subjective: Feels much better today, minimal pain.  No nausea, no flatus yet  Objective: Vital signs in last 24 hours: Temp:  [99.1 F (37.3 C)] 99.1 F (37.3 C) (02/13 1028) Pulse Rate:  [88] 88  (02/13 1028) Resp:  [18] 18  (02/13 1028) BP: (123)/(77) 123/77 mmHg (02/13 1028) SpO2:  [98 %] 98 % (02/13 1028) Last BM Date: 07/05/11  Intake/Output from previous day: 02/13 0701 - 02/14 0700 In: 3125 [I.V.:3125] Out: 2850 [Urine:2850] Intake/Output this shift: Total I/O In: 120 [P.O.:120] Out: -   General appearance: alert and no distress GI: normal findings: soft, non-tender Incision/Wound:Clean and dry  Lab Results:   Basename 07/08/11 0440 07/07/11 0455  WBC 12.2* 13.0*  HGB 12.1* 12.9*  HCT 35.9* 38.4*  PLT 268 269   BMET  Basename 07/07/11 0455  NA 134*  K 3.8  CL 103  CO2 24  GLUCOSE 153*  BUN 8  CREATININE 0.88  CALCIUM 8.3*   PT/INR No results found for this basename: LABPROT:2,INR:2 in the last 72 hours ABG No results found for this basename: PHART:2,PCO2:2,PO2:2,HCO3:2 in the last 72 hours  Studies/Results: No results found.  Anti-infectives: Anti-infectives     Start     Dose/Rate Route Frequency Ordered Stop   07/06/11 1045   ertapenem (INVANZ) 1 g in sodium chloride 0.9 % 50 mL IVPB        1 g 100 mL/hr over 30 Minutes Intravenous 60 min pre-op 07/06/11 1038 07/06/11 1440          Assessment/Plan: s/p Procedure(s): LAPAROSCOPIC PARTIAL COLECTOMY Stable post op, doing well Start CL diet   LOS: 2 days    Borden Thune T 07/08/2011

## 2011-07-09 ENCOUNTER — Telehealth (INDEPENDENT_AMBULATORY_CARE_PROVIDER_SITE_OTHER): Payer: Self-pay

## 2011-07-09 LAB — URINALYSIS, ROUTINE W REFLEX MICROSCOPIC
Glucose, UA: NEGATIVE mg/dL
Leukocytes, UA: NEGATIVE
Nitrite: NEGATIVE
Specific Gravity, Urine: 1.01 (ref 1.005–1.030)
pH: 7 (ref 5.0–8.0)

## 2011-07-09 LAB — URINE MICROSCOPIC-ADD ON

## 2011-07-09 MED ORDER — BISACODYL 10 MG RE SUPP
10.0000 mg | Freq: Once | RECTAL | Status: DC
  Filled 2011-07-09: qty 1

## 2011-07-09 MED ORDER — PROMETHAZINE HCL 25 MG/ML IJ SOLN
25.0000 mg | INTRAMUSCULAR | Status: DC | PRN
  Administered 2011-07-09: 25 mg via INTRAVENOUS
  Filled 2011-07-09: qty 1

## 2011-07-09 NOTE — Telephone Encounter (Signed)
Per Dr Nedra Hai in pathology request I have sent msg to Dr Derrell Lolling who is following pt today. Dr Nedra Hai requested attending MD be sure to review the path on pt.

## 2011-07-09 NOTE — Progress Notes (Signed)
Patient and his family member ask that he not be disturbed and be allowed uninterrupted rest period. They also requested that they not be bothered for hourly rounding.  Explained when I would return to give medications and other care needed.  Sign on door reflecting their request of being not disturbed.

## 2011-07-09 NOTE — Progress Notes (Signed)
Pt wanted to delay/hold suppository; wife stated that her husband had a rough day and night yesterday and that they wanted to hold off for now. Pt wife expressed concerns about pt being nauseaous and urine getting darker. Notified MD to request something additional for nausea as well as we will be sending a UA on the pts urine.

## 2011-07-09 NOTE — Progress Notes (Signed)
Patient's wife came to desk stating he had ate chicken broth she brought into room without nausea and/or vomiting.  She states he has had five loose stools greenish in color, instructed her that we need to see these stools from here on out, as well as emesis and what he has had as intake.  The patient and his wife are requesting doctor be called for an order to change his diet to full liquids .  Dr. Derrell Lolling called, orders received.

## 2011-07-09 NOTE — Progress Notes (Signed)
3 Days Post-Op  Subjective: Awake and alert. Wants to eat regular food. Does not like being in the hospital. Wants to go home.  No stool or flatus yet. Denies nausea. Voiding normally. Ambulating in halls. CBC and b-met yesterday looked fine.  Objective: Vital signs in last 24 hours: Temp:  [98.8 F (37.1 C)-98.9 F (37.2 C)] 98.8 F (37.1 C) (02/14 2208) Pulse Rate:  [75-84] 84  (02/14 2208) Resp:  [18] 18  (02/14 2208) BP: (119-129)/(79-83) 119/83 mmHg (02/14 2208) SpO2:  [95 %] 95 % (02/14 2208) Last BM Date: 07/05/11  Intake/Output from previous day: 02/14 0701 - 02/15 0700 In: 1100 [P.O.:600; I.V.:500] Out: 3975 [Urine:3975] Intake/Output this shift: Total I/O In: 690 [P.O.:240; I.V.:450] Out: 2225 [Urine:2225]  General appearance: alert Resp: clear to auscultation bilaterally GI: soft. Bowel sounds present, hypoactive. Does not appear distended. Minimal tenderness. Infraumbilical incision looks fine, slightly pink.  Lab Results:  No results found for this or any previous visit (from the past 24 hour(s)).   Studies/Results: @RISRSLT24 @     . alvimopan  12 mg Oral BID  . heparin  5,000 Units Subcutaneous Q8H     Assessment/Plan: s/p Procedure(s): LAPAROSCOPIC PARTIAL COLECTOMY  Stable. Await resolution of ileus. Full liquid diet. Continue Entereg Dulcolax suppository Check pathology.  Patient Active Hospital Problem List: No active hospital problems.   LOS: 3 days    Chris Erickson M 07/09/2011  . .prob

## 2011-07-10 DIAGNOSIS — C184 Malignant neoplasm of transverse colon: Secondary | ICD-10-CM | POA: Clinically undetermined

## 2011-07-10 MED ORDER — WITCH HAZEL-GLYCERIN EX PADS
MEDICATED_PAD | CUTANEOUS | Status: DC | PRN
  Filled 2011-07-10: qty 100

## 2011-07-10 NOTE — Progress Notes (Signed)
Upon entering room and introducing my self pt stated he is not due for any medication. Informed pt he is due for his Entereg. Pt is upset and verbalized to his wife " I thought you told them not to come in here. " I have informed him that I was aware of them not wanting hourly rounding, but I had to come in to bring his medication and to introduce my self. Pt asked when will it start that no one is coming in? Pt is agitated and is uncooperative. Asked of him if I can take a look at his surgical site to make sure all of that is looking well. His answer was " NO". I will show it to the doctor in the morning. Pt is agitated based on RN entering his room when he requested no one to go in. Informed pt we need to make sure at times that he is doing fine. Pt further refused staff to enter room.

## 2011-07-10 NOTE — Progress Notes (Signed)
07/10/2011.... 6:19 AM... Time spent listening to pt as he voiced dissatisfaction with his hospitalization experience; pt voiced frustration over still being hospitalized, states he should've gone home days ago; pt states he is tolerating full liquids without nausea/vomiting, and he doesn't understand why he still needs IVF; pt told this RN he wants IVF off, although this RN was able to convince pt to keep IV access just in case it is needed; this RN turned IVF off per pt demand; pt also refused heparin and SCDs- discussed importance of DVT prophylaxis... This RN observed pt ambulating to bathroom several times during the night, and ambulated in hall last night (see flow sheet for time);  Pt has had at least 3 liquid green BMs overnight; no c/o N/V; no c/o pain; urinating in urinal; tolerating full liquids... emotional support given to pt and pt's wife.... Marvia Pickles RN

## 2011-07-10 NOTE — Progress Notes (Addendum)
4 Days Post-Op  Subjective: Episode of nausea and vomiting yesterday morning. Nausea has resolved and he advanced back to a full liquid diet. Last emesis was 24 hours ago. He has had loose green stools. Not much pain.  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 I gave him a copy of the pathology report. I told them that Dr. Johna Sheriff will most likely refer him to a medical oncologist as an outpatient. We talked about this for a long time.  Objective: Vital signs in last 24 hours: Temp:  [97.4 F (36.3 C)-99 F (37.2 C)] 97.4 F (36.3 C) (02/16 0639) Pulse Rate:  [91-109] 109  (02/16 0639) Resp:  [18-20] 20  (02/16 0639) BP: (110-118)/(78-88) 110/85 mmHg (02/16 0639) SpO2:  [95 %-98 %] 95 % (02/16 0639) Last BM Date: 07/10/11  Intake/Output from previous day: 02/15 0701 - 02/16 0700 In: 2040 [P.O.:840; I.V.:1200] Out: 1100 [Urine:400; Stool:700] Intake/Output this shift:    General appearance: alert. Up in chair. In no distress. Very talkative. GI: soft the wounds are okay. Active bowel sounds. Doesn't seem to be distended or tender.  Lab Results:  Results for orders placed during the hospital encounter of 07/06/11 (from the past 24 hour(s))  URINALYSIS, ROUTINE W REFLEX MICROSCOPIC     Status: Abnormal   Collection Time   07/09/11  2:09 PM      Component Value Range   Color, Urine YELLOW  YELLOW    APPearance CLEAR  CLEAR    Specific Gravity, Urine 1.010  1.005 - 1.030    pH 7.0  5.0 - 8.0    Glucose, UA NEGATIVE  NEGATIVE (mg/dL)   Hgb urine dipstick TRACE (*) NEGATIVE    Bilirubin Urine NEGATIVE  NEGATIVE    Ketones, ur TRACE (*) NEGATIVE (mg/dL)   Protein, ur NEGATIVE  NEGATIVE (mg/dL)   Urobilinogen, UA 0.2  0.0 - 1.0 (mg/dL)   Nitrite NEGATIVE  NEGATIVE    Leukocytes, UA NEGATIVE  NEGATIVE   URINE MICROSCOPIC-ADD ON     Status: Abnormal   Collection Time   07/09/11  2:09 PM      Component Value Range   RBC / HPF 0-2  <3 (RBC/hpf)   Bacteria, UA FEW (*) RARE      Studies/Results: @RISRSLT24 @     . alvimopan  12 mg Oral BID  . bisacodyl  10 mg Rectal Once  . heparin  5,000 Units Subcutaneous Q8H     Assessment/Plan: s/p Procedure(s): LAPAROSCOPIC PARTIAL COLECTOMY   Status post laparoscopic-assisted subtotal colectomy. Ileus resolving. T3, N0 adenocarcinoma of transverse colon.  Outlined multidisciplinary issues. Advanced to solid diet. IV fluids discontinued at patient's request Hopefully discharge tomorrow.  Patient Active Hospital Problem List: No active hospital problems.   LOS: 4 days   Darletta Moll M 07/10/2011  . .prob

## 2011-07-10 NOTE — Progress Notes (Signed)
07/10/2011... 1:24 AM... Pt has ambulated in hall x 1 tonight; also ambulates from bed to bathroom; pt having liquid, watery dark green BMs- see I/O flowsheet for details; no vomiting; pt states he is tolerating full liquids and is eager to advance diet; pt also voices desire to d/c home on Saturday; reminded pt and pt's wife of the need for staff to see/measure all stool and urine output- pt and wife verbalize understanding; continue plan of care... RN Marvia Pickles

## 2011-07-11 MED ORDER — HYDROCODONE-ACETAMINOPHEN 5-325 MG PO TABS
1.0000 | ORAL_TABLET | ORAL | Status: DC | PRN
Start: 2011-07-11 — End: 2011-07-19

## 2011-07-11 NOTE — Discharge Summary (Signed)
  Patient ID: Chris Erickson [id number] 50 y.o. 1960-09-05  07/06/2011  Discharge date and time: Feb. 17, 2013  Admitting Physician: Glenna Fellows, MD  Discharge Physician: Glenna Fellows, MD  Admission Diagnoses: multiple colon polyps  Discharge Diagnoses: adenocarcinoma of the transverse colon and multiple colon polyps  Operations: Procedure(s): LAPAROSCOPIC PARTIAL COLECTOMY  Admission Condition: good  Discharged Condition: good  Indication for Admission: this 51 year old Caucasian man has a family history of colon cancer in a brother. He underwent screening colonoscopy by Dr. Evette Cristal who found a large polyp in the transverse colon and multiple other polyps. The patient was referred to Dr. Johna Sheriff Who advised the patient undergo subtotal colectomy. Biopsies of his colon polyps did not show any malignancy preop.  Hospital Course: on the day of admission the patient was taken to the operating room and underwent a laparoscopic-assisted subtotal colectomy. The surgery was uneventful.  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   This was discussed with the patient and his wife and they were given a copy of the pathology report.  Postoperatively the patient was relatively stable. He had an ileus for a couple of days. He had some nausea and vomiting. This all resolved and he resumed bowel function and diet and he was advanced to a regular diet which he tolerated.  He was discharged on February 17. That time he is tolerating regular food was having severall loose stools per day but was drinking plenty of fluids and did not appear to be any distress. He had no pain and his wounds were healing fine.  Diet and activities were discussed. Prescription for Vicodin was given. He was advised to Dr. Johna Sheriff in one week. He was a does not [facility] will probably refer him to  a medical oncologist as an outpatient.  Consults: None  Significant Diagnostic Studies: pathology report  Treatments: surgery: lap assisted subtotal colectomy  Disposition: Home  Patient Instructions:   Kishawn, Pickar  Home Medication Instructions [id number]   Printed on:07/11/11 0636  Medication Information                    guaiFENesin (MUCINEX) 600 MG 12 hr tablet Take 1,200 mg by mouth 2 (two) times daily.           Pseudoephedrine-APAP-DM (DAYQUIL MULTI-SYMPTOM COLD/FLU PO) Take 2 capsules by mouth 2 (two) times daily.           Phenyleph-CPM-DM-APAP (ALKA-SELTZER PLUS COLD & COUGH) 09-22-08-325 MG CAPS Take by mouth.             Activity: no sports or heavy lifting for one month Diet: low fat, low cholesterol diet Wound Care: as directed  Follow-up:  With Dr. Johna Sheriff in 1 week.  Signed: Angelia Mould. Derrell Lolling, M.D., FACS General and minimally invasive surgery Breast and Colorectal Surgery  07/11/2011, 6:36 AM

## 2011-07-11 NOTE — Progress Notes (Signed)
5 Days Post-Op  Subjective: Tolerating regular diet. Eating half of tray. Several loose stools. Taking lots of fluids. Says he's not having any pain. He feels ready to go home.  Objective: Vital signs in last 24 hours: Temp:  [97.4 F (36.3 C)] 97.4 F (36.3 C) (02/16 0639) Pulse Rate:  [109] 109  (02/16 0639) Resp:  [20] 20  (02/16 0639) BP: (110)/(85) 110/85 mmHg (02/16 0639) SpO2:  [95 %] 95 % (02/16 0639) Last BM Date: 07/10/11  Intake/Output from previous day:   Intake/Output this shift:    General appearance: alert. Cooperative. In no distress. GI: abdomen soft. Nondistended. Wounds clean. Minimally tender.  Lab Results:  No results found for this or any previous visit (from the past 24 hour(s)).   Studies/Results: @RISRSLT24 @     . alvimopan  12 mg Oral BID  . bisacodyl  10 mg Rectal Once  . heparin  5,000 Units Subcutaneous Q8H     Assessment/Plan: s/p Procedure(s): LAPAROSCOPIC PARTIAL COLECTOMY  Status post laparoscopic-assisted subtotal colectomy. Ileus resolved. GI function normalized. 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 I discussed multidisciplinary issues with him once again Ready for discharge today. Followup with Dr. Johna Sheriff in one week. I explained to him the Dr. Johna Sheriff will refer him to a medical oncologist at some point. Diet and activities discussed.    LOS: 5 days    Chris Erickson M 07/11/2011  . .prob

## 2011-07-12 ENCOUNTER — Telehealth (INDEPENDENT_AMBULATORY_CARE_PROVIDER_SITE_OTHER): Payer: Self-pay | Admitting: General Surgery

## 2011-07-19 ENCOUNTER — Inpatient Hospital Stay (HOSPITAL_COMMUNITY)
Admission: AD | Admit: 2011-07-19 | Discharge: 2011-07-25 | DRG: 170 | Disposition: A | Discharge status: Home / Self Care | Payer: BC Managed Care – PPO | Source: Ambulatory Visit | Attending: General Surgery | Admitting: General Surgery

## 2011-07-19 ENCOUNTER — Ambulatory Visit
Admission: RE | Admit: 2011-07-19 | Discharge: 2011-07-19 | Disposition: A | Discharge status: Home / Self Care | Payer: BC Managed Care – PPO | Source: Ambulatory Visit | Attending: General Surgery | Admitting: General Surgery

## 2011-07-19 ENCOUNTER — Ambulatory Visit (INDEPENDENT_AMBULATORY_CARE_PROVIDER_SITE_OTHER): Payer: BC Managed Care – PPO | Admitting: General Surgery

## 2011-07-19 ENCOUNTER — Inpatient Hospital Stay (HOSPITAL_COMMUNITY): Payer: BC Managed Care – PPO

## 2011-07-19 ENCOUNTER — Other Ambulatory Visit (INDEPENDENT_AMBULATORY_CARE_PROVIDER_SITE_OTHER): Payer: Self-pay

## 2011-07-19 ENCOUNTER — Encounter (INDEPENDENT_AMBULATORY_CARE_PROVIDER_SITE_OTHER): Payer: Self-pay | Admitting: General Surgery

## 2011-07-19 ENCOUNTER — Telehealth (INDEPENDENT_AMBULATORY_CARE_PROVIDER_SITE_OTHER): Payer: Self-pay

## 2011-07-19 VITALS — BP 114/70 | HR 118 | Ht 71.0 in | Wt 176.0 lb

## 2011-07-19 DIAGNOSIS — Z980 Intestinal bypass and anastomosis status: Secondary | ICD-10-CM

## 2011-07-19 DIAGNOSIS — Y838 Other surgical procedures as the cause of abnormal reaction of the patient, or of later complication, without mention of misadventure at the time of the procedure: Secondary | ICD-10-CM | POA: Diagnosis present

## 2011-07-19 DIAGNOSIS — K56609 Unspecified intestinal obstruction, unspecified as to partial versus complete obstruction: Secondary | ICD-10-CM

## 2011-07-19 DIAGNOSIS — L02219 Cutaneous abscess of trunk, unspecified: Secondary | ICD-10-CM | POA: Diagnosis present

## 2011-07-19 DIAGNOSIS — Z5331 Laparoscopic surgical procedure converted to open procedure: Secondary | ICD-10-CM

## 2011-07-19 DIAGNOSIS — R112 Nausea with vomiting, unspecified: Secondary | ICD-10-CM

## 2011-07-19 DIAGNOSIS — T8140XA Infection following a procedure, unspecified, initial encounter: Secondary | ICD-10-CM | POA: Diagnosis present

## 2011-07-19 DIAGNOSIS — K5669 Other intestinal obstruction: Principal | ICD-10-CM | POA: Diagnosis present

## 2011-07-19 DIAGNOSIS — C184 Malignant neoplasm of transverse colon: Secondary | ICD-10-CM | POA: Diagnosis present

## 2011-07-19 DIAGNOSIS — K560 Paralytic ileus: Secondary | ICD-10-CM | POA: Diagnosis not present

## 2011-07-19 MED ORDER — PROMETHAZINE HCL 25 MG/ML IJ SOLN
12.5000 mg | Freq: Four times a day (QID) | INTRAMUSCULAR | Status: DC | PRN
  Administered 2011-07-19 – 2011-07-20 (×3): 12.5 mg via INTRAVENOUS
  Filled 2011-07-19 (×3): qty 1

## 2011-07-19 MED ORDER — KCL IN DEXTROSE-NACL 20-5-0.9 MEQ/L-%-% IV SOLN
INTRAVENOUS | Status: DC
  Administered 2011-07-19: 1000 mL via INTRAVENOUS
  Administered 2011-07-20: 08:00:00 via INTRAVENOUS
  Filled 2011-07-19 (×5): qty 1000

## 2011-07-19 MED ORDER — PHENOL 1.4 % MT LIQD
1.0000 | OROMUCOSAL | Status: DC | PRN
  Filled 2011-07-19: qty 177

## 2011-07-19 MED ORDER — MORPHINE SULFATE 2 MG/ML IJ SOLN: 2.0000 mg | INTRAMUSCULAR | Status: DC | PRN

## 2011-07-19 MED ORDER — PANTOPRAZOLE SODIUM 40 MG IV SOLR
40.0000 mg | Freq: Every day | INTRAVENOUS | Status: DC
  Administered 2011-07-19 – 2011-07-23 (×5): 40 mg via INTRAVENOUS
  Filled 2011-07-19 (×7): qty 40

## 2011-07-19 NOTE — Telephone Encounter (Signed)
Patient sent for stat labs and xray. [facility] to come to office to see patient around 4.

## 2011-07-19 NOTE — Progress Notes (Signed)
Patient presents to the office post colectomy with nausea and vomiting. Please see admission history and physical dictated today.

## 2011-07-19 NOTE — H&P (Signed)
Chris Erickson is an 51 y.o. male.   Chief Complaint: Nausea and vomiting post colectomy HPI: Patient is a 51 year old male who is almost 2 weeks status post a subtotal laparoscopic colectomy for a cancer of the distal transverse colon as well as a number of other colon polyps, some with atypia. He initially seemed to do well and was discharged home 8 days ago. He did however have one or 2 episodes of vomiting in the hospital prior to discharge. Since being at home he has had progressive difficulty with oral intake. He initially just had some nausea and poor appetite. However for the last 3-4 days he has had several episodes of vomiting per day after attempting to eat or drink. The patient has felt crampy upper and mid abdominal pain, not severe. He has been having several loose bowel movements per day. He had a solid bowel movement yesterday but none since. His symptoms are gradually worsened and he called the office today I asked him to come in for evaluation.  Past Medical History  Diagnosis Date  . Colon polyp   . Colon polyps   . Cold     cold, nonproductive cough last week or so    Past Surgical History  Procedure Date  . Colonscopy jan 2013  . Colon surgery     Family History  Problem Relation Age of Onset  . Cancer Father     asbestos  . Cancer Sister     breast  . Cancer Brother     colon   Social History:  reports that he quit smoking about 11 years ago. His smoking use included Cigarettes. He has a 15 pack-year smoking history. He does not have any smokeless tobacco history on file. He reports that he does not drink alcohol or use illicit drugs.  Allergies:  Allergies  Allergen Reactions  . Latex     rash  . Adhesive (Tape) Rash    Medications Prior to Admission  Medication Sig Dispense Refill  . guaiFENesin (MUCINEX) 600 MG 12 hr tablet Take 1,200 mg by mouth 2 (two) times daily.      Marland Kitchen HYDROcodone-acetaminophen (NORCO) 5-325 MG per tablet Take 1-2 tablets by  mouth every 4 (four) hours as needed for pain.  30 tablet  1  . Phenyleph-CPM-DM-APAP (ALKA-SELTZER PLUS COLD & COUGH) 09-22-08-325 MG CAPS Take by mouth.      . Pseudoephedrine-APAP-DM (DAYQUIL MULTI-SYMPTOM COLD/FLU PO) Take 2 capsules by mouth 2 (two) times daily.       No current facility-administered medications on file as of 07/19/2011.    No results found for this or any previous visit (from the past 48 hour(s)). Dg Abd Acute W/chest  07/19/2011  *RADIOLOGY REPORT*  Clinical Data: Colectomy 1 week ago, nausea and vomiting  ACUTE ABDOMEN SERIES (ABDOMEN 2 VIEW & CHEST 1 VIEW)  Comparison: None.  Findings: The lungs are clear.  Mediastinal contours appear normal. The heart is within normal limits in size.  No bony abnormality is seen.  Supine erect views of the abdomen show very dilated loops of small bowel with air-fluid levels.  This pattern is consistent with small bowel obstruction.  Small bowel loops are dilated up to 53 mm in diameter.  No free intraperitoneal air is seen.  No opaque calculi are noted.  IMPRESSION:  1.  Very dilated loops of small bowel with air-fluid levels most consistent with small bowel obstruction. 2.  No active lung disease.  Original Report Authenticated By: Renae Fickle  D. Gery Pray, M.D.   Lab:  WBC 12.2  Lytes, hbg.U/Q neg  Review of Systems  Constitutional: Positive for malaise/fatigue. Negative for fever and chills.  Respiratory: Positive for shortness of breath.   Cardiovascular: Negative.   Gastrointestinal: Positive for nausea, vomiting and abdominal pain.  Neurological: Positive for weakness.    Blood pressure 114/70, pulse 118, height 5\' 11"  (1.803 m), weight 176 lb (79.833 kg). Physical Exam  General: Alert well-developed Caucasian male who appears mildly ill. Skin: Warm and dry no rash or infection Lungs: Clear without wheezing or increased work of breathing Cardiovascular: Regular mild tachycardia. No edema. Abdomen: Mildly distended. Bowel sounds are  present and somewhat high pitched. Healing small midline and laparoscopic incisions without evidence of infection or tenderness or mass. Somewhat tympanitic. Soft without significant tenderness. Extremities: No edema or cyanosis Neurologic: Alert and fully oriented. Gait normal per Assessment/Plan Early postoperative small bowel obstruction. Etiology is not clear. I cannot feel an incisional hernia. This would still be a possibility as well as early inflammatory adhesions, anastomotic edema, or torsion or internal hernia. The patient will be directly admitted and started on IV fluids and NG suction. We'll obtain a stat CT scan of the abdomen and pelvis  Aerika Groll T 07/19/2011, 4:02 PM

## 2011-07-20 ENCOUNTER — Encounter (HOSPITAL_COMMUNITY): Payer: Self-pay | Admitting: *Deleted

## 2011-07-20 ENCOUNTER — Encounter (HOSPITAL_COMMUNITY): Payer: Self-pay | Admitting: Anesthesiology

## 2011-07-20 ENCOUNTER — Inpatient Hospital Stay (HOSPITAL_COMMUNITY): Payer: BC Managed Care – PPO | Admitting: Anesthesiology

## 2011-07-20 ENCOUNTER — Encounter (HOSPITAL_COMMUNITY)
Admission: AD | Disposition: A | Discharge status: Home / Self Care | Payer: Self-pay | Source: Ambulatory Visit | Attending: General Surgery

## 2011-07-20 DIAGNOSIS — K56609 Unspecified intestinal obstruction, unspecified as to partial versus complete obstruction: Secondary | ICD-10-CM

## 2011-07-20 HISTORY — PX: LAPAROTOMY: SHX154

## 2011-07-20 HISTORY — PX: LAPAROSCOPY: SHX197

## 2011-07-20 LAB — BASIC METABOLIC PANEL
Calcium: 9 mg/dL (ref 8.4–10.5)
Chloride: 87 mEq/L — ABNORMAL LOW (ref 96–112)
Creat: 1.1 mg/dL (ref 0.50–1.35)
Creatinine, Ser: 0.93 mg/dL (ref 0.50–1.35)
GFR calc Af Amer: >90 mL/min (ref >90)
Potassium: 4.8 mEq/L (ref 3.5–5.3)
Sodium: 126 mEq/L — ABNORMAL LOW (ref 135–145)

## 2011-07-20 LAB — CBC WITH DIFFERENTIAL/PLATELET
Basophils Absolute: 0 10*3/uL (ref 0.0–0.1)
Eosinophils Relative: 1 % (ref 0–5)
Lymphocytes Relative: 18 % (ref 12–46)
Lymphs Abs: 3.2 10*3/uL (ref 0.7–4.0)
Neutro Abs: 12.8 10*3/uL — ABNORMAL HIGH (ref 1.7–7.7)
Neutrophils Relative %: 72 % (ref 43–77)
Platelets: 635 10*3/uL — ABNORMAL HIGH (ref 150–400)
RBC: 5.34 MIL/uL (ref 4.22–5.81)
RDW: 13.2 % (ref 11.5–15.5)
WBC: 17.8 10*3/uL — ABNORMAL HIGH (ref 4.0–10.5)

## 2011-07-20 LAB — CBC
Platelets: 518 10*3/uL — ABNORMAL HIGH (ref 150–400)
RDW: 12.8 % (ref 11.5–15.5)
WBC: 14.9 10*3/uL — ABNORMAL HIGH (ref 4.0–10.5)

## 2011-07-20 LAB — SURGICAL PCR SCREEN: Staphylococcus aureus: NEGATIVE

## 2011-07-20 SURGERY — LAPAROSCOPY, DIAGNOSTIC
Anesthesia: General | Site: Abdomen | Wound class: Clean Contaminated

## 2011-07-20 MED ORDER — MORPHINE SULFATE (PF) 1 MG/ML IV SOLN
INTRAVENOUS | Status: DC
  Administered 2011-07-20: 19:00:00 via INTRAVENOUS
  Filled 2011-07-20: qty 25

## 2011-07-20 MED ORDER — MIDAZOLAM HCL 5 MG/5ML IJ SOLN
INTRAMUSCULAR | Status: DC | PRN
  Administered 2011-07-20: 2 mg via INTRAVENOUS

## 2011-07-20 MED ORDER — ACETAMINOPHEN 10 MG/ML IV SOLN
INTRAVENOUS | Status: DC | PRN
  Administered 2011-07-20: 1000 mg via INTRAVENOUS

## 2011-07-20 MED ORDER — SODIUM CHLORIDE 0.9 % IV SOLN
1.0000 g | INTRAVENOUS | Status: DC | PRN
  Administered 2011-07-20: 1 g via INTRAVENOUS

## 2011-07-20 MED ORDER — ONDANSETRON HCL 4 MG/2ML IJ SOLN: 4.0000 mg | Freq: Four times a day (QID) | INTRAMUSCULAR | Status: DC | PRN

## 2011-07-20 MED ORDER — NALOXONE HCL 0.4 MG/ML IJ SOLN: 0.4000 mg | INTRAMUSCULAR | Status: DC | PRN

## 2011-07-20 MED ORDER — MORPHINE SULFATE (PF) 1 MG/ML IV SOLN
INTRAVENOUS | Status: AC
  Filled 2011-07-20: qty 25

## 2011-07-20 MED ORDER — SODIUM CHLORIDE 0.9 % IJ SOLN: 9.0000 mL | INTRAMUSCULAR | Status: DC | PRN

## 2011-07-20 MED ORDER — LIDOCAINE HCL (CARDIAC) 20 MG/ML IV SOLN
INTRAVENOUS | Status: DC | PRN
  Administered 2011-07-20: 80 mg via INTRAVENOUS

## 2011-07-20 MED ORDER — DIPHENHYDRAMINE HCL 12.5 MG/5ML PO ELIX
12.5000 mg | ORAL_SOLUTION | Freq: Four times a day (QID) | ORAL | Status: DC | PRN
  Filled 2011-07-20: qty 5

## 2011-07-20 MED ORDER — IOHEXOL 300 MG/ML  SOLN
100.0000 mL | Freq: Once | INTRAMUSCULAR | Status: AC | PRN
  Administered 2011-07-20: 100 mL via INTRAVENOUS

## 2011-07-20 MED ORDER — KCL IN DEXTROSE-NACL 20-5-0.9 MEQ/L-%-% IV SOLN
INTRAVENOUS | Status: DC
  Administered 2011-07-20 – 2011-07-24 (×9): via INTRAVENOUS
  Filled 2011-07-20 (×15): qty 1000

## 2011-07-20 MED ORDER — HYDROMORPHONE HCL PF 1 MG/ML IJ SOLN
0.2500 mg | INTRAMUSCULAR | Status: DC | PRN
  Administered 2011-07-20 (×2): 0.5 mg via INTRAVENOUS

## 2011-07-20 MED ORDER — DIPHENHYDRAMINE HCL 50 MG/ML IJ SOLN: 12.5000 mg | Freq: Four times a day (QID) | INTRAMUSCULAR | Status: DC | PRN

## 2011-07-20 MED ORDER — NEOSTIGMINE METHYLSULFATE 1 MG/ML IJ SOLN
INTRAMUSCULAR | Status: DC | PRN
  Administered 2011-07-20: 4 mg via INTRAVENOUS

## 2011-07-20 MED ORDER — GLYCOPYRROLATE 0.2 MG/ML IJ SOLN
INTRAMUSCULAR | Status: DC | PRN
  Administered 2011-07-20: .6 mg via INTRAVENOUS

## 2011-07-20 MED ORDER — SUFENTANIL CITRATE 50 MCG/ML IV SOLN
INTRAVENOUS | Status: DC | PRN
  Administered 2011-07-20: 10 ug via INTRAVENOUS
  Administered 2011-07-20: 20 ug via INTRAVENOUS
  Administered 2011-07-20 (×2): 10 ug via INTRAVENOUS

## 2011-07-20 MED ORDER — SODIUM CHLORIDE 0.9 % IV SOLN: INTRAVENOUS | Status: DC

## 2011-07-20 MED ORDER — SUCCINYLCHOLINE CHLORIDE 20 MG/ML IJ SOLN
INTRAMUSCULAR | Status: DC | PRN
  Administered 2011-07-20: 80 mg via INTRAVENOUS

## 2011-07-20 MED ORDER — ONDANSETRON HCL 4 MG/2ML IJ SOLN
INTRAMUSCULAR | Status: DC | PRN
  Administered 2011-07-20: 4 mg via INTRAVENOUS

## 2011-07-20 MED ORDER — CISATRACURIUM BESYLATE 2 MG/ML IV SOLN
INTRAVENOUS | Status: DC | PRN
  Administered 2011-07-20 (×2): 4 mg via INTRAVENOUS
  Administered 2011-07-20: 2 mg via INTRAVENOUS
  Administered 2011-07-20: 3 mg via INTRAVENOUS
  Administered 2011-07-20: 2 mg via INTRAVENOUS
  Administered 2011-07-20: 5 mg via INTRAVENOUS
  Administered 2011-07-20: 4 mg via INTRAVENOUS

## 2011-07-20 MED ORDER — HYDROMORPHONE HCL PF 1 MG/ML IJ SOLN
INTRAMUSCULAR | Status: AC
  Filled 2011-07-20: qty 1

## 2011-07-20 MED ORDER — HYDROMORPHONE 0.3 MG/ML IV SOLN
INTRAVENOUS | Status: DC
  Administered 2011-07-20: 21:00:00 via INTRAVENOUS
  Administered 2011-07-21: 2.1 mg via INTRAVENOUS
  Administered 2011-07-21: 19:00:00 via INTRAVENOUS
  Administered 2011-07-21: 3 mg via INTRAVENOUS
  Administered 2011-07-21: 3.3 mg via INTRAVENOUS
  Administered 2011-07-21: 2.87 mg via INTRAVENOUS
  Administered 2011-07-21: 3.6 mg via INTRAVENOUS
  Administered 2011-07-21: 25 mg via INTRAVENOUS
  Administered 2011-07-22: 0.6 mg via INTRAVENOUS

## 2011-07-20 MED ORDER — HYDROMORPHONE 0.3 MG/ML IV SOLN
INTRAVENOUS | Status: AC
  Administered 2011-07-21: 25 mg via INTRAVENOUS
  Filled 2011-07-20: qty 25

## 2011-07-20 MED ORDER — ENOXAPARIN SODIUM 40 MG/0.4ML ~~LOC~~ SOLN
40.0000 mg | SUBCUTANEOUS | Status: DC
  Administered 2011-07-21: 40 mg via SUBCUTANEOUS
  Filled 2011-07-20 (×6): qty 0.4

## 2011-07-20 MED ORDER — KCL IN DEXTROSE-NACL 20-5-0.9 MEQ/L-%-% IV SOLN
INTRAVENOUS | Status: AC
  Administered 2011-07-21: 1000 mL
  Filled 2011-07-20: qty 1000

## 2011-07-20 MED ORDER — BUPIVACAINE-EPINEPHRINE (PF) 0.5% -1:200000 IJ SOLN
INTRAMUSCULAR | Status: AC
  Filled 2011-07-20: qty 10

## 2011-07-20 MED ORDER — PROMETHAZINE HCL 25 MG/ML IJ SOLN: 6.2500 mg | INTRAMUSCULAR | Status: DC | PRN

## 2011-07-20 MED ORDER — HYDROMORPHONE HCL PF 1 MG/ML IJ SOLN
INTRAMUSCULAR | Status: DC | PRN
  Administered 2011-07-20 (×2): 1 mg via INTRAVENOUS

## 2011-07-20 MED ORDER — SODIUM CHLORIDE 0.9 % IV SOLN
INTRAVENOUS | Status: DC | PRN
  Administered 2011-07-20 (×4): via INTRAVENOUS

## 2011-07-20 MED ORDER — PROPOFOL 10 MG/ML IV EMUL
INTRAVENOUS | Status: DC | PRN
  Administered 2011-07-20: 150 mg via INTRAVENOUS

## 2011-07-20 MED ORDER — DIPHENHYDRAMINE HCL 12.5 MG/5ML PO ELIX: 12.5000 mg | ORAL_SOLUTION | Freq: Four times a day (QID) | ORAL | Status: DC | PRN

## 2011-07-20 SURGICAL SUPPLY — 57 items
APPLICATOR COTTON TIP 6IN STRL (MISCELLANEOUS) ×2 IMPLANT
BENZOIN TINCTURE PRP APPL 2/3 (GAUZE/BANDAGES/DRESSINGS) IMPLANT
BLADE EXTENDED COATED 6.5IN (ELECTRODE) IMPLANT
BLADE HEX COATED 2.75 (ELECTRODE) ×2 IMPLANT
CANISTER SUCTION 2500CC (MISCELLANEOUS) ×2 IMPLANT
CANNULA ENDOPATH XCEL 11M (ENDOMECHANICALS) IMPLANT
CATH FOLEY LATEX FREE 16FR (CATHETERS) ×2 IMPLANT
CATH THORACIC 28FR (CATHETERS) ×2 IMPLANT
CLOTH BEACON ORANGE TIMEOUT ST (SAFETY) ×2 IMPLANT
CONNECTOR 5 IN 1 STRAIGHT STRL (MISCELLANEOUS) ×2 IMPLANT
COVER MAYO STAND STRL (DRAPES) ×2 IMPLANT
DECANTER SPIKE VIAL GLASS SM (MISCELLANEOUS) IMPLANT
DERMABOND ADVANCED (GAUZE/BANDAGES/DRESSINGS)
DERMABOND ADVANCED .7 DNX12 (GAUZE/BANDAGES/DRESSINGS) IMPLANT
DRAPE LAPAROSCOPIC ABDOMINAL (DRAPES) ×2 IMPLANT
DRAPE WARM FLUID 44X44 (DRAPE) ×2 IMPLANT
ELECT REM PT RETURN 9FT ADLT (ELECTROSURGICAL) ×2
ELECTRODE REM PT RTRN 9FT ADLT (ELECTROSURGICAL) ×1 IMPLANT
GLOVE BIOGEL PI IND STRL 7.0 (GLOVE) ×1 IMPLANT
GLOVE BIOGEL PI INDICATOR 7.0 (GLOVE) ×1
GOWN STRL NON-REIN LRG LVL3 (GOWN DISPOSABLE) ×2 IMPLANT
GOWN STRL REIN XL XLG (GOWN DISPOSABLE) ×4 IMPLANT
KIT BASIN OR (CUSTOM PROCEDURE TRAY) ×2 IMPLANT
LUBRICANT JELLY K Y 4OZ (MISCELLANEOUS) ×2 IMPLANT
NS IRRIG 1000ML POUR BTL (IV SOLUTION) ×2 IMPLANT
PACK GENERAL/GYN (CUSTOM PROCEDURE TRAY) ×4 IMPLANT
SCALPEL HARMONIC ACE (MISCELLANEOUS) IMPLANT
SEPRAFILM PROCEDURAL PACK 3X5 (MISCELLANEOUS) ×2 IMPLANT
SET IRRIG TUBING LAPAROSCOPIC (IRRIGATION / IRRIGATOR) IMPLANT
SOLUTION ANTI FOG 6CC (MISCELLANEOUS) ×2 IMPLANT
SPONGE GAUZE 4X4 12PLY (GAUZE/BANDAGES/DRESSINGS) ×2 IMPLANT
SPONGE GAUZE 4X4 FOR O.R. (GAUZE/BANDAGES/DRESSINGS) ×2 IMPLANT
SPONGE LAP 18X18 X RAY DECT (DISPOSABLE) IMPLANT
STAPLER VISISTAT 35W (STAPLE) ×2 IMPLANT
STRIP CLOSURE SKIN 1/2X4 (GAUZE/BANDAGES/DRESSINGS) IMPLANT
SUCTION POOLE TIP (SUCTIONS) ×2 IMPLANT
SUT PDS AB 1 CTX 36 (SUTURE) ×4 IMPLANT
SUT PDS AB 1 TP1 96 (SUTURE) ×2 IMPLANT
SUT SILK 2 0 (SUTURE) ×1
SUT SILK 2 0 SH (SUTURE) ×4 IMPLANT
SUT SILK 2 0 SH CR/8 (SUTURE) ×2 IMPLANT
SUT SILK 2-0 18XBRD TIE 12 (SUTURE) ×1 IMPLANT
SUT SILK 3 0 (SUTURE)
SUT SILK 3 0 SH CR/8 (SUTURE) ×2 IMPLANT
SUT SILK 3-0 18XBRD TIE 12 (SUTURE) IMPLANT
SUT VIC AB 4-0 PS2 27 (SUTURE) IMPLANT
TAPE CLOTH SURG 4X10 WHT LF (GAUZE/BANDAGES/DRESSINGS) ×2 IMPLANT
TOWEL OR 17X26 10 PK STRL BLUE (TOWEL DISPOSABLE) ×4 IMPLANT
TRAY FOLEY CATH 14FRSI W/METER (CATHETERS) IMPLANT
TRAY LAP CHOLE (CUSTOM PROCEDURE TRAY) IMPLANT
TROCAR BLADELESS OPT 5 75 (ENDOMECHANICALS) ×6 IMPLANT
TROCAR XCEL BLUNT TIP 100MML (ENDOMECHANICALS) IMPLANT
TROCAR XCEL NON-BLD 11X100MML (ENDOMECHANICALS) IMPLANT
TUBING CONNECTING 10 (TUBING) ×2 IMPLANT
TUBING INSUFFLATION 10FT LAP (TUBING) ×2 IMPLANT
YANKAUER SUCT BULB TIP 10FT TU (MISCELLANEOUS) ×2 IMPLANT
YANKAUER SUCT BULB TIP NO VENT (SUCTIONS) IMPLANT

## 2011-07-20 NOTE — Transfer of Care (Signed)
Immediate Anesthesia Transfer of Care Note  Patient: Chris Erickson  Procedure(s) Performed: Procedure(s) (LRB): LAPAROSCOPY DIAGNOSTIC (N/A) EXPLORATORY LAPAROTOMY (N/A)  Patient Location: PACU  Anesthesia Type: General  Level of Consciousness: awake and sedated  Airway & Oxygen Therapy: Patient Spontanous Breathing and Patient connected to nasal cannula oxygen  Post-op Assessment: Report given to PACU RN and Post -op Vital signs reviewed and stable  Post vital signs: Reviewed and stable  Complications: No apparent anesthesia complications

## 2011-07-20 NOTE — Progress Notes (Signed)
Patient has not void, bladder scan showed 300 cc . MD notified.Will continue to evaluate.

## 2011-07-20 NOTE — Preoperative (Signed)
Beta Blockers   Reason not to administer Beta Blockers:Not Applicable 

## 2011-07-20 NOTE — Progress Notes (Signed)
INITIAL ADULT NUTRITION ASSESSMENT Date: 07/20/2011   Time: 10:15 AM Reason for Assessment: Nutrition risk   ASSESSMENT: Male 51 y.o.  Dx: Nausea and vomiting post colectomy  Hx:  Past Medical History  Diagnosis Date  . Colon polyp   . Colon polyps   . Cold     cold, nonproductive cough last week or so   Related Meds:  Scheduled Meds:   . pantoprazole (PROTONIX) IV  40 mg Intravenous QHS   Continuous Infusions:   . dextrose 5 % and 0.9 % NaCl with KCl 20 mEq/L 125 mL/hr at 07/20/11 0824   PRN Meds:.iohexol, morphine, phenol, promethazine  Ht: 5\' 11"  (180.3 cm)  Wt: 176 lb (79.833 kg)  Ideal Wt: 78.2kg % Ideal Wt: 102  Usual Wt: 89kg % Usual Wt: 89  Body mass index is 24.55 kg/(m^2).  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 Pt has been unable to eat for the past 3-4 days r/t nausea, vomiting, diarrhea, and abdominal pain. Pt reports 1 episode of vomiting this morning, mostly mucous per wife's report, and NGT in place. Pt reports indigestion any time he tries to eat or drink something. Pt reports 20 pound unintentional weight loss in the past 2 weeks. Pt denies any loose stools today. Pt found to have volvulus at distal ileum per CT scan. Pt scheduled to proceed with surgery today for exploratory laparoscopy.   CT of abdomen/pelvis on 2/26 showed: 1. Small bowel obstruction noted, with diffuse distension of the  small bowel to 4.3 cm in diameter. No evidence of perforation. No  free fluid seen. This appears to reflect a focal volvulus along  the distal ileum, perhaps 10 cm proximal to the expected location  of the non-visualized distal ileocolic anastomosis above the  rectum; the volvulus measures approximately 270 degrees, with a  whirlpool sign involving the bowel.  2. Scattered calcification along the distal abdominal aorta and  its branches.  3. Enlarged prostate.  Labs:  CMP       Component Value Date/Time   NA 123* 07/20/2011 0407   K 4.1 07/20/2011 0407   CL 86* 07/20/2011 0407   CO2 26 07/20/2011 0407   GLUCOSE 128* 07/20/2011 0407   BUN 28* 07/20/2011 0407   CREATININE 0.93 07/20/2011 0407   CREATININE 1.10 07/19/2011 1158   CALCIUM 9.0 07/20/2011 0407   GFRNONAA >90 07/20/2011 0407   GFRAA >90 07/20/2011 0407    Intake/Output Summary (Last 24 hours) at 07/20/11 1021 Last data filed at 07/20/11 1610  Gross per 24 hour  Intake   1260 ml  Output   1150 ml  Net    110 ml   Last BM - 07/10/11, however pt reported diarrhea PTA   Diet Order: NPO    IVF:    dextrose 5 % and 0.9 % NaCl with KCl 20 mEq/L Last Rate: 125 mL/hr at 07/20/11 0824    Estimated Nutritional Needs:   Kcal:2050-2400 Protein:95-120g Fluid:2-2.4L  NUTRITION DIAGNOSIS: -Inadequate oral intake (NI-2.1).  Status: Ongoing -Pt meets criteria for severe PCM of acute illness AEB 10% weight loss in the past 2 weeks with <50% energy intake for the past week, pt c/o weakness on arrival   RELATED TO: volvulus, planned surgery  AS EVIDENCE BY: NPO  MONITORING/EVALUATION(Goals):  1. Advance diet as tolerated to low fiber diet.  2. If diet unable to be advanced shortly after surgery and pt likely to be NPO >  7 days, recommend TNA, as pt with significant weight loss and poor intake on admission.   EDUCATION NEEDS: -Education needs addressed - wife had questions regarding nutrition therapy for nausea and nutritional supplements for weight gain - discussed bland diet for nausea and talked about nutritional supplements. All questions answered.   INTERVENTION: Diet advancement/nutrition support per MD. Will monitor.   Dietitian #: [phone]  DOCUMENTATION CODES Per approved criteria  -Severe malnutrition in the context of acute illness or injury    Marshall Cork 07/20/2011, 10:15 AM

## 2011-07-20 NOTE — Anesthesia Postprocedure Evaluation (Signed)
  Anesthesia Post-op Note  Patient: Chris Erickson  Procedure(s) Performed: Procedure(s) (LRB): LAPAROSCOPY DIAGNOSTIC (N/A) EXPLORATORY LAPAROTOMY (N/A)  Patient Location: PACU  Anesthesia Type: General  Level of Consciousness: awake and alert   Airway and Oxygen Therapy: Patient Spontanous Breathing  Post-op Pain: mild  Post-op Assessment: Post-op Vital signs reviewed, Patient's Cardiovascular Status Stable, Respiratory Function Stable, Patent Airway and No signs of Nausea or vomiting  Post-op Vital Signs: stable  Complications: No apparent anesthesia complications

## 2011-07-20 NOTE — Progress Notes (Signed)
Patient ID: Chris Erickson, male   DOB: May 26, 1960, 51 y.o.   MRN: [id number]    Subjective: Patient feels a little better after NG tube with no discomfort but still bloated. No bowel movements.  Objective: Vital signs in last 24 hours: Temp:  [97.5 F (36.4 C)-98.6 F (37 C)] 98.6 F (37 C) (02/26 0615) Pulse Rate:  [86-118] 98  (02/26 0615) Resp:  [18] 18  (02/26 0615) BP: (99-123)/(69-81) 115/81 mmHg (02/26 0615) SpO2:  [96 %-98 %] 96 % (02/26 0615) Weight:  [176 lb (79.833 kg)] 176 lb (79.833 kg) (02/25 1903) Last BM Date: 07/10/11  Intake/Output from previous day: 02/25 0701 - 02/26 0700 In: 1260 [I.V.:1250; IV Piggyback:10] Out: 750 [Emesis/NG output:750] Intake/Output this shift:    General appearance: alert, fatigued and no distress GI: abdomen remains mildly to moderately distended without significant tenderness.  Lab Results:   Basename 07/20/11 0407 07/19/11 1158  WBC 14.9* 17.8*  HGB 14.6 16.6  HCT 40.5 47.1  PLT 518* 635*   BMET  Basename 07/20/11 0407 07/19/11 1158  NA 123* 126*  K 4.1 4.8  CL 86* 87*  CO2 26 26  GLUCOSE 128* 138*  BUN 28* 24*  CREATININE 0.93 1.10  CALCIUM 9.0 9.7     Studies/Results: Ct Abdomen Pelvis W Contrast  07/20/2011  *RADIOLOGY REPORT*  Clinical Data: Abdominal pain, nausea and vomiting.  CT ABDOMEN AND PELVIS WITH CONTRAST  Technique:  Multidetector CT imaging of the abdomen and pelvis was performed following the standard protocol during bolus administration of intravenous contrast.  Contrast:  100 mL of Omnipaque 300 IV contrast  Comparison: Abdominal radiograph performed 07/19/2011  Findings: Minimal bibasilar atelectasis is noted.  The liver and spleen are unremarkable in appearance.  The gallbladder is within normal limits.  The pancreas and adrenal glands are unremarkable.  Minimal nonspecific stranding noted bilaterally.  The kidneys are otherwise unremarkable in appearance.  There is no evidence of  hydronephrosis.  No renal or ureteral stones are seen.  The patient appears status post total colectomy, with a non- visualized distal ileal - distal sigmoid anastomosis.  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  The whirlpool sign involves the bowel itself rather than the mesenteric vasculature; the underlying mesentery is not well assessed.  There is diffuse distension of the more proximal small bowel, partially filled with fluid and air, measuring up to 4.3 cm in diameter, compatible with bowel obstruction.  There is no evidence of perforation at this time.  No free fluid is seen.  The patient's enteric tube is noted ending at the body of the stomach.  A small amount of contrast is noted within the stomach; the stomach is grossly unremarkable.  No acute vascular abnormalities are seen.  Scattered calcification is noted along the distal abdominal aorta and its branches.  Postoperative change is noted along the anterior abdominal wall, with minimal soft tissue air noted at the level of the anterior pelvis, tracking along the inguinal canals bilaterally.  The bladder is moderately distended and grossly unremarkable in appearance.  The prostate is enlarged, measuring 5.2 cm in transverse dimension.  No inguinal lymphadenopathy is seen.  No acute osseous abnormalities are identified.  IMPRESSION:  1.  Small bowel obstruction noted, with diffuse distension of the small bowel to 4.3 cm  in diameter.  No evidence of perforation.  No free fluid seen.  This appears to reflect a focal volvulus along the distal ileum, perhaps 10 cm proximal to the expected location of the non-visualized distal ileocolic anastomosis above the rectum; the volvulus measures approximately 270 degrees, with a whirlpool sign involving the bowel. 2.   Scattered calcification along the distal abdominal aorta and its branches. 3.  Enlarged prostate.  These results were called by telephone on 07/20/2011  at  04:21 a.m. to  Nursing on [facility], who verbally acknowledged these results.  Original Report Authenticated By: Tonia Ghent, M.D.   Dg Abd Acute W/chest  07/19/2011  *RADIOLOGY REPORT*  Clinical Data: Colectomy 1 week ago, nausea and vomiting  ACUTE ABDOMEN SERIES (ABDOMEN 2 VIEW & CHEST 1 VIEW)  Comparison: None.  Findings: The lungs are clear.  Mediastinal contours appear normal. The heart is within normal limits in size.  No bony abnormality is seen.  Supine erect views of the abdomen show very dilated loops of small bowel with air-fluid levels.  This pattern is consistent with small bowel obstruction.  Small bowel loops are dilated up to 53 mm in diameter.  No free intraperitoneal air is seen.  No opaque calculi are noted.  IMPRESSION:  1.  Very dilated loops of small bowel with air-fluid levels most consistent with small bowel obstruction. 2.  No active lung disease.  Original Report Authenticated By: Juline Patch, M.D.   Dg Abd Portable 1v  07/19/2011  *RADIOLOGY REPORT*  Clinical Data: Nasogastric tube placement.  PORTABLE ABDOMEN - 1 VIEW  Comparison:  07/19/2011  Findings: There is a nasogastric tube in the left upper abdomen and probably within the gastric fundus.  Again noted are dilated loops of small bowel throughout the abdomen and suggestive for a small bowel obstruction.  Visualized lungs are clear.  Lucency underneath the left hemidiaphragm is probably related to the stomach.  IMPRESSION: Nasogastric tube in the upper stomach.  Dilated loops of small bowel.  Original Report Authenticated By: Richarda Overlie, M.D.    Anti-infectives: Anti-infectives    None      Assessment/Plan: I have reviewed the CT scan with the radiologist. There appears to be a twist or kink in the distal ileum above the level of the anastomosis by about  10 cm. The mesentery itself does not appear swirled. However there is an apparent very high-grade to complete obstruction in this area. This finding is worrisome and I have recommended to the patient that we proceed with exploration. The findings and indications for surgery were discussed extensively with the patient and his wife. I recommended proceeding initially with laparoscopy but explained that open surgery would likely be necessary and that conceivably we could have to resect bowel or redo the anastomosis or even place a diverting loop ileostomy. This was explained to the patient and risks of bleeding and infection were discussed. Alternative of continued nonoperative management was discussed which I do not believe would be safe and they are in agreement. We will proceed today.   LOS: 1 day    Schon Zeiders T 07/20/2011

## 2011-07-20 NOTE — Op Note (Signed)
Preoperative Diagnosis: small bowel obstruction bowel obstruction  Postoprative Diagnosis: small bowel obstruction bowel obstruction  Procedure: Procedure(s): LAPAROSCOPY DIAGNOSTIC EXPLORATORY LAPAROTOMY   Surgeon: Glenna Fellows T   Assistants: Luretha Murphy  Anesthesia:  General endotracheal anesthesiaDiagnos  Indications:  Patient is a 51 year old male who is 2 weeks following laparoscopic subtotal colectomy with end-to-end anastomosis of the terminal ileum to the distal sigmoid colon. The patient initially did well and was discharged home but has re presented with a small bowel obstruction. CT scan shows obstruction just proximal to the anastomosis with a apparent twist or volvulus of the ileum although the mesentery did not appear twisted. With this finding and high-grade obstruction I recommended proceeding with initially laparoscopy and possible laparotomy. We have discussed the nature of the procedure and risks detailed in my progress note today.   Procedure Detail: Patient is brought to the operating room,placed in the supine position on the operating table and general endotracheal anesthesia induced. PAS were in place. He received preoperative broad-spectrum IV antibiotics. The abdomen was widely sterilely prepped and draped and the patient time out performed and correct procedure verified. Access was obtained without difficulty using a previous 5 mm trocar site in the left upper quadrant. Pneumoperitoneum was established. Laparoscopy showed numerous markedly dilated loops of small bowel. 2 additional 5 mm trochars were placed one through a previous site and one through a new site in the left lower quadrant. The small bowel was then carefully run. Due to to the marked dilatation it was impossible to get completely up to the ligament of Treitz. I was able to follow the bowel distally and came down to the anastomosis. As I elevated the distal bowel and the anastomosis it seemed as  a pulled a 5-10 cm loop of small bowel out through the posterior mesenteric defect. At this point holding the ilium anteriorly and cranially the anastomosis could be well visualized and appeared normal. There was however a densely. Loop of small bowel to the anastomosis and this was carefully taken down with sharp and blunt dissection. The rectosigmoid was decompressed. I followed it down to the peritoneal reflection and then back up to the anastomosis and as I did this there did appear to be somewhat of a twist at the anastomosis which appeared likely to be causing obstruction. I wanted to be sure there was no twist in the small bowel mesentery but laparoscopically I really could not adequately examine the bowel more proximally and therefore it was elected to convert to an open procedure at this time. The previous small low midline incision was used it was extended up above the umbilicus for several centimeters. Dissection was carried down through the subcutaneous and midline fascia and peritoneum with cautery. I was then able to with retraction clearly identify the ligament of Treitz and keeping the mesentery correctly oriented working distally we followed the small bowel down to the anastomosis. Other than being dilated it was completely normal. Holding the ileum and mesentery in its normal orientation the anastomosis was widely patent and appeared normal but there was an approximately 180 twist of the sigmoid mesentery when the anastomosis was held open in normal position. I suspect that the sigmoid had rotated causing a twist in the distal ileum which than likely has also caused some herniation of the small bowel through the mesenteric defect. We could clearly see that if we closed the mesenteric defect with this orientation it would hold the sigmoid colon in place and the anastomosis would be  widely patent and the degree of twist of the sigmoid mesocolon was causing no obstruction or problem with the sigmoid  colon. Prior to doing this Dr. Daphine Deutscher went below and passed a chest tube transanally which I intra-abdominally directed up through the anastomosis which was widely patent and the markedly dilated small bowel contents were milked distally and the small bowel completely decompressed. The mesenteric defect was then closed with interrupted 2-0 silk's again holding the sigmoid colon in its orientation which kept the terminal ileum completely straight and the anastomosis widely patent. To also hold the anastomosis patent we then pexed the sigmoid colon over to the pelvic sidewall with a couple of 2-0 silk sutures. At this point were very satisfied that the bowel was all widely patent. The abdomen was then thoroughly irrigated with multiple liters of warm saline. Seprafilm was placed over the bowel loops beneath the abdominal wall in the midline incision was closed with looped #1 PDS begun at either end of the incision and tied centrally. The subcutaneous tissue was irrigated and skin closed with staples. Sponge needle and instrument counts were correct.   Findings: As above  Estimated Blood Loss:  Minimal         Drains: nnone  Blood Given: none          Specimens: none        Complications:  * No complications entered in OR log *         Disposition: PACU - hemodynamically stable.         Condition: stable  Mariella Saa MD, FACS  07/20/2011, 5:38 PM

## 2011-07-20 NOTE — Anesthesia Procedure Notes (Signed)
Procedure Name: Intubation Date/Time: 07/20/2011 1:57 PM Performed by: Lurlean Leyden, Juniel Groene L. Patient Re-evaluated:Patient Re-evaluated prior to inductionOxygen Delivery Method: Circle system utilized Preoxygenation: Pre-oxygenation with 100% oxygen Intubation Type: IV induction, Rapid sequence and Cricoid Pressure applied Laryngoscope Size: Miller and 3 Grade View: Grade I Tube type: Oral Tube size: 8.0 mm Number of attempts: 1 Airway Equipment and Method: Stylet Placement Confirmation: ETT inserted through vocal cords under direct vision,  breath sounds checked- equal and bilateral and positive ETCO2 Secured at: 21 cm Tube secured with: Tape Dental Injury: Teeth and Oropharynx as per pre-operative assessment

## 2011-07-20 NOTE — Anesthesia Preprocedure Evaluation (Addendum)
Anesthesia Evaluation  Patient identified by MRN, date of birth, ID band Patient awake    Reviewed: Allergy & Precautions, H&P , NPO status , Patient's Chart, lab work & pertinent test results  Airway Mallampati: II TM Distance: >3 FB Neck ROM: Full    Dental No notable dental hx.    Pulmonary neg pulmonary ROS,  clear to auscultation  Pulmonary exam normal       Cardiovascular neg cardio ROS Regular Normal    Neuro/Psych Negative Neurological ROS  Negative Psych ROS   GI/Hepatic negative GI ROS, Neg liver ROS,   Endo/Other  Negative Endocrine ROS  Renal/GU negative Renal ROS  Genitourinary negative   Musculoskeletal negative musculoskeletal ROS (+)   Abdominal   Peds negative pediatric ROS (+)  Hematology negative hematology ROS (+)   Anesthesia Other Findings   Reproductive/Obstetrics negative OB ROS                           Anesthesia Physical Anesthesia Plan  ASA: II and Emergent  Anesthesia Plan: General   Post-op Pain Management:    Induction: Intravenous  Airway Management Planned: Oral ETT  Additional Equipment:   Intra-op Plan:   Post-operative Plan: Extubation in OR  Informed Consent: I have reviewed the patients History and Physical, chart, labs and discussed the procedure including the risks, benefits and alternatives for the proposed anesthesia with the patient or authorized representative who has indicated his/her understanding and acceptance.   Dental advisory given  Plan Discussed with: CRNA  Anesthesia Plan Comments: (Low sodium noted. NG in place )       Anesthesia Quick Evaluation

## 2011-07-20 NOTE — Addendum Note (Signed)
Addendum  created 07/20/11 1814 by Joycie Peek, CRNA   Modules edited:Anesthesia Flowsheet

## 2011-07-20 NOTE — Progress Notes (Signed)
Dr. Daphine Deutscher notified of patient ct scan showing a volvulus at the distal ileum. Will continue to monitor

## 2011-07-20 NOTE — Addendum Note (Signed)
Addendum  created 07/20/11 1815 by Joycie Peek, CRNA   Modules edited:Anesthesia Flowsheet

## 2011-07-21 LAB — BASIC METABOLIC PANEL
CO2: 20 mEq/L (ref 19–32)
Calcium: 7.8 mg/dL — ABNORMAL LOW (ref 8.4–10.5)
Glucose, Bld: 169 mg/dL — ABNORMAL HIGH (ref 70–99)
Potassium: 5.7 mEq/L — ABNORMAL HIGH (ref 3.5–5.1)
Sodium: 126 mEq/L — ABNORMAL LOW (ref 135–145)

## 2011-07-21 LAB — CBC
Hemoglobin: 15.2 g/dL (ref 13.0–17.0)
MCH: 30 pg (ref 26.0–34.0)
RBC: 5.07 MIL/uL (ref 4.22–5.81)

## 2011-07-21 MED ORDER — HYDROMORPHONE 0.3 MG/ML IV SOLN
INTRAVENOUS | Status: AC
  Filled 2011-07-21: qty 25

## 2011-07-21 MED ORDER — ACETAMINOPHEN 10 MG/ML IV SOLN
1000.0000 mg | Freq: Four times a day (QID) | INTRAVENOUS | Status: AC
  Administered 2011-07-21 – 2011-07-22 (×4): 1000 mg via INTRAVENOUS
  Filled 2011-07-21 (×5): qty 100

## 2011-07-21 NOTE — Progress Notes (Signed)
Patient ID: Chris Erickson, male   DOB: 1961-03-25, 51 y.o.   MRN: [id number] 1 Day Post-Op  Subjective: Incisional pain, now better with change in meds.  Overall feels better than pre op  Objective: Vital signs in last 24 hours: Temp:  [96.7 F (35.9 C)-99.3 F (37.4 C)] 99.2 F (37.3 C) (02/27 0611) Pulse Rate:  [87-110] 100  (02/27 0611) Resp:  [10-24] 20  (02/27 0822) BP: (106-130)/(63-85) 111/78 mmHg (02/27 0611) SpO2:  [95 %-99 %] 98 % (02/27 0822) Last BM Date:  (pta)  Intake/Output from previous day: 02/26 0701 - 02/27 0700 In: 5365 [P.O.:240; I.V.:5125] Out: 2775 [Urine:1275; Emesis/NG output:1450; Blood:50] Intake/Output this shift:    General appearance: alert and no distress GI: normal findings: soft, non-tender Incision/Wound:Dressing clean and dry  Lab Results:   Basename 07/21/11 0350 07/20/11 0407  WBC 14.0* 14.9*  HGB 15.2 14.6  HCT 44.6 40.5  PLT 522* 518*   BMET  Basename 07/21/11 0350 07/20/11 0407  NA 126* 123*  K 5.7* 4.1  CL 100 86*  CO2 20 26  GLUCOSE 169* 128*  BUN 22 28*  CREATININE 1.12 0.93  CALCIUM 7.8* 9.0     Studies/Results: Ct Abdomen Pelvis W Contrast  07/20/2011  *RADIOLOGY REPORT*  Clinical Data: Abdominal pain, nausea and vomiting.  CT ABDOMEN AND PELVIS WITH CONTRAST  Technique:  Multidetector CT imaging of the abdomen and pelvis was performed following the standard protocol during bolus administration of intravenous contrast.  Contrast:  100 mL of Omnipaque 300 IV contrast  Comparison: Abdominal radiograph performed 07/19/2011  Findings: Minimal bibasilar atelectasis is noted.  The liver and spleen are unremarkable in appearance.  The gallbladder is within normal limits.  The pancreas and adrenal glands are unremarkable.  Minimal nonspecific stranding noted bilaterally.  The kidneys are otherwise unremarkable in appearance.  There is no evidence of hydronephrosis.  No renal or ureteral stones are seen.  The patient appears  status post total colectomy, with a non- visualized distal ileal - distal sigmoid anastomosis.  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  The whirlpool sign involves the bowel itself rather than the mesenteric vasculature; the underlying mesentery is not well assessed.  There is diffuse distension of the more proximal small bowel, partially filled with fluid and air, measuring up to 4.3 cm in diameter, compatible with bowel obstruction.  There is no evidence of perforation at this time.  No free fluid is seen.  The patient's enteric tube is noted ending at the body of the stomach.  A small amount of contrast is noted within the stomach; the stomach is grossly unremarkable.  No acute vascular abnormalities are seen.  Scattered calcification is noted along the distal abdominal aorta and its branches.  Postoperative change is noted along the anterior abdominal wall, with minimal soft tissue air noted at the level of the anterior pelvis, tracking along the inguinal canals bilaterally.  The bladder is moderately distended and grossly unremarkable in appearance.  The prostate is enlarged, measuring 5.2 cm in transverse dimension.  No inguinal lymphadenopathy is seen.  No acute osseous abnormalities are identified.  IMPRESSION:  1.  Small bowel obstruction noted, with diffuse distension of the small bowel to 4.3 cm in diameter.  No evidence of perforation.  No free fluid seen.  This appears to reflect a focal volvulus along the distal ileum, perhaps 10 cm proximal to the expected location of the non-visualized distal ileocolic anastomosis above the rectum; the volvulus measures approximately 270 degrees, with a whirlpool sign involving the bowel. 2.  Scattered calcification along the distal abdominal aorta and its branches. 3.   Enlarged prostate.  These results were called by telephone on 07/20/2011  at  04:21 a.m. to  Nursing on [facility], who verbally acknowledged these results.  Original Report Authenticated By: Tonia Ghent, M.D.   Dg Abd Acute W/chest  07/19/2011  *RADIOLOGY REPORT*  Clinical Data: Colectomy 1 week ago, nausea and vomiting  ACUTE ABDOMEN SERIES (ABDOMEN 2 VIEW & CHEST 1 VIEW)  Comparison: None.  Findings: The lungs are clear.  Mediastinal contours appear normal. The heart is within normal limits in size.  No bony abnormality is seen.  Supine erect views of the abdomen show very dilated loops of small bowel with air-fluid levels.  This pattern is consistent with small bowel obstruction.  Small bowel loops are dilated up to 53 mm in diameter.  No free intraperitoneal air is seen.  No opaque calculi are noted.  IMPRESSION:  1.  Very dilated loops of small bowel with air-fluid levels most consistent with small bowel obstruction. 2.  No active lung disease.  Original Report Authenticated By: Juline Patch, M.D.   Dg Abd Portable 1v  07/19/2011  *RADIOLOGY REPORT*  Clinical Data: Nasogastric tube placement.  PORTABLE ABDOMEN - 1 VIEW  Comparison:  07/19/2011  Findings: There is a nasogastric tube in the left upper abdomen and probably within the gastric fundus.  Again noted are dilated loops of small bowel throughout the abdomen and suggestive for a small bowel obstruction.  Visualized lungs are clear.  Lucency underneath the left hemidiaphragm is probably related to the stomach.  IMPRESSION: Nasogastric tube in the upper stomach.  Dilated loops of small bowel.  Original Report Authenticated By: Richarda Overlie, M.D.    Anti-infectives: Anti-infectives    None      Assessment/Plan: s/p Procedure(s): LAPAROSCOPY DIAGNOSTIC EXPLORATORY LAPAROTOMY Stable/improved post op.  Continue NG.  OOB and pulmonary toilet today    LOS: 2 days    Melitza Metheny T 07/21/2011

## 2011-07-21 NOTE — Progress Notes (Signed)
Pt and wife requested that I give pt his Lovenox shot in his thigh.  I explained to them that policy stated that we weren't allowed to do that, and we were supposed to give the shot in the "love handle" region.   Both pt and wife stated that he was given Lovenox in the thigh all last week and did not understand why I couldn't now.  Pt stated that it hurt less in the  Thigh.  Wife then said "some of you nurses need to be a pt so you will understand what pain feels like".  Pt would not let me give him the shot in the "love handle " region, but did agree to let me give him the shot in the buttocks.

## 2011-07-21 NOTE — Progress Notes (Signed)
Pt states first four teeth are numb. He stated that this occurred last week when eating.-------A. Aundria Rud, [facility] SN

## 2011-07-22 ENCOUNTER — Encounter (HOSPITAL_COMMUNITY): Payer: Self-pay | Admitting: General Surgery

## 2011-07-22 ENCOUNTER — Encounter (INDEPENDENT_AMBULATORY_CARE_PROVIDER_SITE_OTHER): Payer: BC Managed Care – PPO | Admitting: General Surgery

## 2011-07-22 LAB — BASIC METABOLIC PANEL
BUN: 14 mg/dL (ref 6–23)
GFR calc Af Amer: >90 mL/min (ref >90)
GFR calc non Af Amer: >90 mL/min (ref >90)
Potassium: 4.5 mEq/L (ref 3.5–5.1)
Sodium: 128 mEq/L — ABNORMAL LOW (ref 135–145)

## 2011-07-22 LAB — CBC
HCT: 32.6 % — ABNORMAL LOW (ref 39.0–52.0)
MCHC: 32.8 g/dL (ref 30.0–36.0)
RDW: 13.5 % (ref 11.5–15.5)

## 2011-07-22 MED ORDER — ACETAMINOPHEN 650 MG RE SUPP: 650.0000 mg | Freq: Four times a day (QID) | RECTAL | Status: DC | PRN

## 2011-07-22 MED ORDER — NALOXONE HCL 0.4 MG/ML IJ SOLN: 0.4000 mg | INTRAMUSCULAR | Status: DC | PRN

## 2011-07-22 MED ORDER — SODIUM CHLORIDE 0.9 % IJ SOLN: 9.0000 mL | INTRAMUSCULAR | Status: DC | PRN

## 2011-07-22 MED ORDER — SODIUM CHLORIDE 0.9 % IV BOLUS (SEPSIS): 500.0000 mL | Freq: Once | INTRAVENOUS | Status: DC

## 2011-07-22 MED ORDER — HYDROMORPHONE 0.3 MG/ML IV SOLN
INTRAVENOUS | Status: AC
  Filled 2011-07-22: qty 25

## 2011-07-22 MED ORDER — DIPHENHYDRAMINE HCL 50 MG/ML IJ SOLN: 12.5000 mg | Freq: Four times a day (QID) | INTRAMUSCULAR | Status: DC | PRN

## 2011-07-22 MED ORDER — ONDANSETRON HCL 4 MG/2ML IJ SOLN: 4.0000 mg | Freq: Four times a day (QID) | INTRAMUSCULAR | Status: DC | PRN

## 2011-07-22 MED ORDER — SODIUM CHLORIDE 0.9 % IV BOLUS (SEPSIS)
1000.0000 mL | Freq: Once | INTRAVENOUS | Status: AC
  Administered 2011-07-22: 1000 mL via INTRAVENOUS

## 2011-07-22 MED ORDER — DIPHENHYDRAMINE HCL 12.5 MG/5ML PO ELIX: 12.5000 mg | ORAL_SOLUTION | Freq: Four times a day (QID) | ORAL | Status: DC | PRN

## 2011-07-22 MED ORDER — HYDROMORPHONE 0.3 MG/ML IV SOLN
INTRAVENOUS | Status: DC
  Administered 2011-07-22: 1.5 mg via INTRAVENOUS
  Administered 2011-07-22: 1.2 mg via INTRAVENOUS
  Administered 2011-07-22: 0.9 mg via INTRAVENOUS
  Administered 2011-07-22 – 2011-07-23 (×3): via INTRAVENOUS
  Administered 2011-07-23 (×2): 1.8 mg via INTRAVENOUS
  Administered 2011-07-23: 3 mg via INTRAVENOUS
  Administered 2011-07-24: 0.3 mg via INTRAVENOUS
  Administered 2011-07-24: 10 mL via INTRAVENOUS
  Administered 2011-07-24: 1.5 mg via INTRAVENOUS

## 2011-07-22 NOTE — Progress Notes (Signed)
Patient calling out complaining of "I just don't feel right", "I feel like someone has taken something away from me", "feel stupid". C/O tounge tingling, top lip tingling, tounge feeling dry, then forehead tingling. States had this happen yesterday morning and it went away. C/O "kidneys hurt". VS taken and WNL. Encouraged PCA. Emotional suppport given. Encouraged to attempt to urinate.  Patient reports feeling better. To report if have any changes or sensations persist or worsen. To conitnue to moniter.

## 2011-07-22 NOTE — Progress Notes (Signed)
Patient having decreased urinary output, 100 cc since 1500 today, bladder scanned 125 cc. IVF at 123ml/hr. MD on call notified, orders given and initiated. To continue to moniter.

## 2011-07-22 NOTE — Progress Notes (Signed)
Patient ID: FREEMAN BORBA, male   DOB: 11-20-1960, 51 y.o.   MRN: [id number] 2 Days Post-Op  Subjective: Hates NG, eating a lot of ice.  No BM or flatus.  Abdomen feels "fine".  Ha sbeen up and walking  Objective: Vital signs in last 24 hours: Temp:  [96 F (35.6 C)-98.1 F (36.7 C)] 97.6 F (36.4 C) (02/28 0610) Pulse Rate:  [78-97] 78  (02/28 0610) Resp:  [16-22] 20  (02/28 0610) BP: (99-116)/(65-77) 106/71 mmHg (02/28 0610) SpO2:  [94 %-99 %] 96 % (02/28 0610) Last BM Date: 07/15/11  Intake/Output from previous day: 02/27 0701 - 02/28 0700 In: 3900.7 [P.O.:121; I.V.:3049.7; NG/GT:30] Out: 2400 [Urine:650; Emesis/NG output:1750] Intake/Output this shift:    General appearance: alert and no distress GI: normal findings: soft, non-tender, quiet Incision/Wound:Dressing clean and dry  Lab Results:   Basename 07/22/11 0421 07/21/11 0350  WBC 7.3 14.0*  HGB 10.9* 15.2  HCT 32.6* 44.6  PLT 386 522*   BMET  Basename 07/22/11 0421 07/21/11 0350  NA 128* 126*  K 4.5 5.7*  CL 99 100  CO2 24 20  GLUCOSE 96 169*  BUN 14 22  CREATININE 0.80 1.12  CALCIUM 7.7* 7.8*     Studies/Results: No results found.  Anti-infectives: Anti-infectives    None      Assessment/Plan: s/p Procedure(s): LAPAROSCOPY DIAGNOSTIC EXPLORATORY LAPAROTOMY Stable/improved post op.  Expected ileus.  Cont NG for now Hbg drop- prob rehydration-follow   LOS: 3 days    Meelah Tallo T 07/22/2011  fg

## 2011-07-23 LAB — BASIC METABOLIC PANEL
GFR calc Af Amer: >90 mL/min (ref >90)
GFR calc non Af Amer: >90 mL/min (ref >90)
Glucose, Bld: 97 mg/dL (ref 70–99)
Potassium: 4.1 mEq/L (ref 3.5–5.1)
Sodium: 131 mEq/L — ABNORMAL LOW (ref 135–145)

## 2011-07-23 LAB — CBC
Hemoglobin: 11.3 g/dL — ABNORMAL LOW (ref 13.0–17.0)
MCHC: 33.1 g/dL (ref 30.0–36.0)
Platelets: 415 10*3/uL — ABNORMAL HIGH (ref 150–400)

## 2011-07-23 MED ORDER — SIMETHICONE 80 MG PO CHEW
80.0000 mg | CHEWABLE_TABLET | Freq: Four times a day (QID) | ORAL | Status: DC | PRN
  Administered 2011-07-23: 80 mg via ORAL
  Filled 2011-07-23: qty 1

## 2011-07-23 MED ORDER — HYDROMORPHONE 0.3 MG/ML IV SOLN
INTRAVENOUS | Status: AC
  Filled 2011-07-23: qty 25

## 2011-07-23 MED ORDER — HYDROMORPHONE 0.3 MG/ML IV SOLN
INTRAVENOUS | Status: AC
  Administered 2011-07-23: 3.6 mg via INTRAVENOUS
  Filled 2011-07-23: qty 25

## 2011-07-23 NOTE — Progress Notes (Signed)
Patient complain of gas pain, NG tube d/c'd today, tolerating ice and sips of water PO, ambulated x4 today. MD on call notified. Orders received and initiated. Continue to [employer].

## 2011-07-23 NOTE — Telephone Encounter (Signed)
Patient still hospitalized as of 07/23/11

## 2011-07-23 NOTE — Progress Notes (Signed)
Patient requesting pain medicine , not wanting to use IV PCA medicine at this time, requesting if can have tylenol or another pain medication for pain. Pt not taking PO pain meds at this time, NG tube in place. MD on call notified. Notified also of patient urine output 100 cc documented since 0700 this am and patient reports it is normal that he doesn't urinate much. Orders received and initiated. Verified with patient accuracy of urinary output to be correct, notified patient of MD orders and reason, Patient acknowleges MD orders. Patient refuses tylenol suppository and IV bolus at this time. To continue to moniter.

## 2011-07-23 NOTE — Progress Notes (Signed)
Patient ID: Chris Erickson, male   DOB: 1960-10-12, 51 y.o.   MRN: [id number] 3 Days Post-Op  Subjective: No C/O.  Feels better, rested well.  No flatus or BM yet.  Objective: Vital signs in last 24 hours: Temp:  [97.5 F (36.4 C)-98.1 F (36.7 C)] 97.8 F (36.6 C) (03/01 0608) Pulse Rate:  [74-89] 74  (03/01 0608) Resp:  [16-18] 16  (03/01 0819) BP: (102-113)/(64-70) 113/68 mmHg (03/01 0608) SpO2:  [95 %-99 %] 98 % (03/01 0819) Last BM Date: 07/15/11  Intake/Output from previous day: 02/28 0701 - 03/01 0700 In: 3343.3 [P.O.:360; I.V.:2983.3] Out: 1520 [Urine:450; Emesis/NG output:1070] Intake/Output this shift:    General appearance: alert and no distress GI: normal findings: few bowel sounds  and soft, non-tender  Lab Results:   [facility] 07/23/11 0417 07/22/11 0421  WBC 7.1 7.3  HGB 11.3* 10.9*  HCT 34.1* 32.6*  PLT 415* 386   BMET  Basename 07/23/11 0417 07/22/11 0421  NA 131* 128*  K 4.1 4.5  CL 100 99  CO2 25 24  GLUCOSE 97 96  BUN 10 14  CREATININE 0.68 0.80  CALCIUM 8.4 7.7*     Studies/Results: No results found.  Anti-infectives: Anti-infectives    None      Assessment/Plan: s/p Procedure(s): LAPAROSCOPY DIAGNOSTIC EXPLORATORY LAPAROTOMY Stable / improving post op.  Will D/C NGT today. Sips water PO only   LOS: 4 days    [name] T 07/23/2011  fg

## 2011-07-24 MED ORDER — PANTOPRAZOLE SODIUM 40 MG PO TBEC
40.0000 mg | DELAYED_RELEASE_TABLET | Freq: Every day | ORAL | Status: DC
  Administered 2011-07-24: 40 mg via ORAL
  Filled 2011-07-24 (×2): qty 1

## 2011-07-24 MED ORDER — ONDANSETRON HCL 4 MG/2ML IJ SOLN: 4.0000 mg | Freq: Four times a day (QID) | INTRAMUSCULAR | Status: DC | PRN

## 2011-07-24 MED ORDER — SODIUM CHLORIDE 0.9 % IJ SOLN: 9.0000 mL | INTRAMUSCULAR | Status: DC | PRN

## 2011-07-24 MED ORDER — IBUPROFEN 600 MG PO TABS
600.0000 mg | ORAL_TABLET | Freq: Four times a day (QID) | ORAL | Status: DC | PRN
  Administered 2011-07-24 – 2011-07-25 (×3): 600 mg via ORAL
  Filled 2011-07-24 (×3): qty 1

## 2011-07-24 MED ORDER — DIPHENHYDRAMINE HCL 50 MG/ML IJ SOLN: 12.5000 mg | Freq: Four times a day (QID) | INTRAMUSCULAR | Status: DC | PRN

## 2011-07-24 MED ORDER — SODIUM CHLORIDE 0.9 % IV SOLN: 1.0000 g | INTRAVENOUS | Status: DC

## 2011-07-24 MED ORDER — NALOXONE HCL 0.4 MG/ML IJ SOLN: 0.4000 mg | INTRAMUSCULAR | Status: DC | PRN

## 2011-07-24 MED ORDER — DIPHENHYDRAMINE HCL 12.5 MG/5ML PO ELIX: 12.5000 mg | ORAL_SOLUTION | Freq: Four times a day (QID) | ORAL | Status: DC | PRN

## 2011-07-24 MED ORDER — HYDROMORPHONE 0.3 MG/ML IV SOLN: INTRAVENOUS | Status: DC

## 2011-07-24 NOTE — Progress Notes (Signed)
Patient ID: Chris Erickson, male   DOB: 03/21/1961, 51 y.o.   MRN: [id number] 4 Days Post-Op  Subjective: No nausea and with NG out. No flatus or bowel movement. Has been walking frequently. Notices he is urinating a large volume and slight ankle swelling.  Objective: Vital signs in last 24 hours: Temp:  [98.2 F (36.8 C)-99 F (37.2 C)] 98.8 F (37.1 C) (03/02 0617) Pulse Rate:  [76-79] 78  (03/02 0617) Resp:  [18] 18  (03/02 0617) BP: (97-109)/(66-69) 109/69 mmHg (03/02 0617) SpO2:  [97 %-100 %] 98 % (03/02 0617) Last BM Date: 07/15/11  Intake/Output from previous day: 03/01 0701 - 03/02 0700 In: 0  Out: 1800 [Urine:1800] Intake/Output this shift: Total I/O In: -  Out: 500 [Urine:500]  General appearance: alert and no distress GI: normal findings: soft, non-tender, bowel sounds minimal if at all Incision/Wound: small area of faint erythema just to the right side of the incision otherwise looks fine  Lab Results:   [facility] 07/23/11 0417 07/22/11 0421  WBC 7.1 7.3  HGB 11.3* 10.9*  HCT 34.1* 32.6*  PLT 415* 386   BMET  Basename 07/23/11 0417 07/22/11 0421  NA 131* 128*  K 4.1 4.5  CL 100 99  CO2 25 24  GLUCOSE 97 96  BUN 10 14  CREATININE 0.68 0.80  CALCIUM 8.4 7.7*     Studies/Results: No results found.  Anti-infectives: Anti-infectives    None      Assessment/Plan: s/p Procedure(s): LAPAROSCOPY DIAGNOSTIC EXPLORATORY LAPAROTOMY Stable but appears to have some expected ileus. We'll continue sips of water only by mouth today. He is having some spontaneous diuresis which is likely a good sign and his IV rate is only 75 cc per hour.   LOS: 5 days    Karisma Meiser T 07/24/2011  Rolinda Roan

## 2011-07-25 MED ORDER — CEPHALEXIN 500 MG PO CAPS: 500.0000 mg | ORAL_CAPSULE | Freq: Three times a day (TID) | ORAL | Status: DC

## 2011-07-25 NOTE — Progress Notes (Signed)
Patient ID: COLIE FUGITT, male   DOB: January 01, 1961, 51 y.o.   MRN: [id number] 5 Days Post-Op  Subjective: Feels much better. He has had several loose bowel movements. He is tolerating clear liquids without any difficulty. Very anxious to go home.  Objective: Vital signs in last 24 hours: Temp:  [98.4 F (36.9 C)-99.1 F (37.3 C)] 98.4 F (36.9 C) (03/03 0601) Pulse Rate:  [78-84] 84  (03/03 0601) Resp:  [18] 18  (03/03 0601) BP: (96-103)/(62-69) 96/62 mmHg (03/03 0601) SpO2:  [98 %-100 %] 98 % (03/03 0601) Last BM Date: 07/24/11  Intake/Output from previous day: 03/02 0701 - 03/03 0700 In: -  Out: 1900 [Urine:1450; Stool:450] Intake/Output this shift:    General appearance: alert and no distress GI: normal findings: soft, non-tender Incision/Wound: small area of faint erythema just to the right of his midline incision without unusual drainage.  Lab Results:   [facility] 07/23/11 0417  WBC 7.1  HGB 11.3*  HCT 34.1*  PLT 415*   BMET  Basename 07/23/11 0417  NA 131*  K 4.1  CL 100  CO2 25  GLUCOSE 97  BUN 10  CREATININE 0.68  CALCIUM 8.4     Studies/Results: No results found.  Anti-infectives: Anti-infectives     Start     Dose/Rate Route Frequency Ordered Stop   07/24/11 1715   ertapenem (INVANZ) 1 g in sodium chloride 0.9 % 50 mL IVPB  Status:  Discontinued        1 g 100 mL/hr over 30 Minutes Intravenous 60 min pre-op 07/24/11 1702 07/24/11 1715          Assessment/Plan: s/p Procedure(s): LAPAROSCOPY DIAGNOSTIC EXPLORATORY LAPAROTOMY Doing well. Has return of bowel function without evidence of persistent obstruction. He is very anxious to go home and I think this would be okay. Will continue full liquid diet for 2 days and then advance. He may have some very mild cellulitis of his incision and going to cover this with Keflex 500 mg 3 times a day for 5 days. Return to the office later this week for a wound check and staple removal.   LOS: 6  days    Chanice Brenton T 07/25/2011

## 2011-07-25 NOTE — Discharge Summary (Signed)
   Patient ID: JAHLON BAINES [id number] 51 y.o. 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 He initially did well but presents with several days of increasing nausea and vomiting and abdominal distention. KUB as an outpatient revealed high-grade small bowel obstruction and he was admitted for further evaluation and treatment.  Hospital Course: on the evening of admission a CT scan of the abdomen and pelvis was obtained showing an apparent twist or herniation of the small bowel just above the anastomosis resulting in high-grade small bowel obstruction. The following morning the patient was taken to the operating room for exploration. He was found to have a kinking or twisting just above the anastomosis with possibly some herniation through the mesenteric defect as well. He underwent laparotomy with closure of the mesenteric defect and fixation of the intestine to prevent further twisting. The patient tolerated the procedure well.  He initially was maintained on NG suction and IV fluid. His white blood count returned to normal and his abdomen remained benign. His NG tube was removed on the third postop day and he tolerated this well. Later that day he began having frequent loose bowel movements. He was started on a clear liquid diet which he tolerated well. By the day of admission he was feeling significantly better and tolerating  liquids. Abdomen is soft and nontender. He is very anxious to go home. His wound is healing well although there is a tiny faint area of erythema just on one side of the incision, possible early cellulitis, and will be discharged home on oral Keflex with close outpatient followup.  Disposition: Home  Patient Instructions:   Sloane, Junkin  Home Medication Instructions JXB:[id number]   Printed on:07/25/11 0932  Medication Information                    ibuprofen (ADVIL,MOTRIN) 200 MG tablet Take 200 mg by mouth every 6 (six) hours as needed. For pain relief           cephALEXin (KEFLEX) 500 MG capsule Take 1 capsule (500 mg total) by mouth 3 (three) times daily.             Activity: no heavy lifting for 4 weeks Diet: full liquids for 2 days and then gradually advance diet Wound Care: none needed  Follow-up:  With Dr. Johna Sheriff in 4 days.  Signed: Mariella Saa MD, FACS  07/25/2011, 9:32 AM

## 2011-07-26 ENCOUNTER — Telehealth (INDEPENDENT_AMBULATORY_CARE_PROVIDER_SITE_OTHER): Payer: Self-pay

## 2011-07-26 NOTE — Telephone Encounter (Signed)
Called to schedule follow up appointment (07/29/11 @ 12:05)

## 2011-07-27 ENCOUNTER — Telehealth (INDEPENDENT_AMBULATORY_CARE_PROVIDER_SITE_OTHER): Payer: Self-pay

## 2011-07-27 ENCOUNTER — Other Ambulatory Visit (INDEPENDENT_AMBULATORY_CARE_PROVIDER_SITE_OTHER): Payer: Self-pay

## 2011-07-27 ENCOUNTER — Telehealth (INDEPENDENT_AMBULATORY_CARE_PROVIDER_SITE_OTHER): Payer: Self-pay | Admitting: General Surgery

## 2011-07-27 DIAGNOSIS — G8918 Other acute postprocedural pain: Secondary | ICD-10-CM

## 2011-07-27 MED ORDER — IBUPROFEN 600 MG PO TABS: 600.0000 mg | ORAL_TABLET | Freq: Four times a day (QID) | ORAL | Status: AC

## 2011-07-27 NOTE — Telephone Encounter (Signed)
Patient to take Imodium (otc) as package directions for diarrhea and an rx for 600 mg Ibuprofen called in to [facility])  Will call our office back if symptoms do not improve.

## 2011-07-27 NOTE — Telephone Encounter (Signed)
Patient c/o continuous watery bowel movements, he's not sleeping much at night because of the excessive trips to the bathroom.  Patient wife would like to know if he can take OTC medication for diarrhea.  Mr. Olver denies any nausea/vomiting or fevers.

## 2011-07-27 NOTE — Telephone Encounter (Signed)
Patient's wife called back with additional questions. Did not elaborate, only wants to speak with you. Please call back. [phone].

## 2011-07-28 ENCOUNTER — Ambulatory Visit (INDEPENDENT_AMBULATORY_CARE_PROVIDER_SITE_OTHER): Payer: BC Managed Care – PPO | Admitting: General Surgery

## 2011-07-28 ENCOUNTER — Encounter (INDEPENDENT_AMBULATORY_CARE_PROVIDER_SITE_OTHER): Payer: Self-pay | Admitting: General Surgery

## 2011-07-28 ENCOUNTER — Other Ambulatory Visit (INDEPENDENT_AMBULATORY_CARE_PROVIDER_SITE_OTHER): Payer: Self-pay | Admitting: General Surgery

## 2011-07-28 VITALS — BP 100/62 | HR 76 | Temp 97.2°F | Ht 71.5 in | Wt 173.0 lb

## 2011-07-28 DIAGNOSIS — Z09 Encounter for follow-up examination after completed treatment for conditions other than malignant neoplasm: Secondary | ICD-10-CM

## 2011-07-28 DIAGNOSIS — T8149XA Infection following a procedure, other surgical site, initial encounter: Secondary | ICD-10-CM

## 2011-07-28 DIAGNOSIS — T8140XA Infection following a procedure, unspecified, initial encounter: Secondary | ICD-10-CM

## 2011-07-28 NOTE — Patient Instructions (Signed)
Pack wound daily with moist saline gauze

## 2011-07-28 NOTE — Progress Notes (Signed)
History: The patient returns to the office following reexploration for bowel obstruction post laparoscopic subtotal colectomy. Yesterday he developed some bloody drainage from his wound. He did have a little erythema on discharge and we enabled Keflex 3 times a day. He otherwise is feeling better and tolerating diet. He had some initial diarrhea that has slowed down. He has no further symptoms to suggest obstruction.  Exam: Gen.: An acute distress Abdomen: Generally soft and nontender. He does have bloody purulent drainage from the lower aspect of his midline incision.  Assessment and plan: Generally doing well although he does have a superficial wound infection. All the staples were removed. Up and about 2 cm of his wound down toward the fascia and drained a small amount of purulent bloody material. This was packed with moist saline gauze. I should his wife how to change the dressing daily and she is comfortable with this. A culture was obtained and he will continue Keflex for now. Return in one week.

## 2011-07-29 ENCOUNTER — Encounter (INDEPENDENT_AMBULATORY_CARE_PROVIDER_SITE_OTHER): Payer: BC Managed Care – PPO | Admitting: General Surgery

## 2011-07-29 ENCOUNTER — Telehealth (INDEPENDENT_AMBULATORY_CARE_PROVIDER_SITE_OTHER): Payer: Self-pay

## 2011-07-29 DIAGNOSIS — G8918 Other acute postprocedural pain: Secondary | ICD-10-CM

## 2011-07-29 MED ORDER — TRAMADOL HCL 50 MG PO TABS: ORAL_TABLET | ORAL | Status: DC

## 2011-07-31 LAB — WOUND CULTURE: Gram Stain: NONE SEEN

## 2011-08-02 NOTE — Telephone Encounter (Signed)
Patients wife called saying she is having to change dressings way more than she was told to do so due to excessive draining. Finished Keflex on Friday and seems to be feeling worse since stopped taking it. Wonders if he needs to be put back on it. Says top of wound red now. Offered urge appointment today but declined because only wants to see [facility]. Told them BH off today and they said they will wait to talk to [facility] tomorrow when he is pageable.

## 2011-08-03 ENCOUNTER — Other Ambulatory Visit (INDEPENDENT_AMBULATORY_CARE_PROVIDER_SITE_OTHER): Payer: Self-pay

## 2011-08-03 DIAGNOSIS — L089 Local infection of the skin and subcutaneous tissue, unspecified: Secondary | ICD-10-CM

## 2011-08-03 MED ORDER — CEPHALEXIN 500 MG PO CAPS: 500.0000 mg | ORAL_CAPSULE | Freq: Three times a day (TID) | ORAL | Status: DC

## 2011-08-06 ENCOUNTER — Encounter (INDEPENDENT_AMBULATORY_CARE_PROVIDER_SITE_OTHER): Payer: Self-pay | Admitting: General Surgery

## 2011-08-06 ENCOUNTER — Ambulatory Visit (INDEPENDENT_AMBULATORY_CARE_PROVIDER_SITE_OTHER): Payer: BC Managed Care – PPO | Admitting: General Surgery

## 2011-08-06 VITALS — BP 102/64 | HR 100 | Temp 97.4°F | Ht 71.5 in | Wt 169.2 lb

## 2011-08-06 DIAGNOSIS — L089 Local infection of the skin and subcutaneous tissue, unspecified: Secondary | ICD-10-CM | POA: Insufficient documentation

## 2011-08-06 DIAGNOSIS — T148XXA Other injury of unspecified body region, initial encounter: Secondary | ICD-10-CM | POA: Insufficient documentation

## 2011-08-06 MED ORDER — CEPHALEXIN 500 MG PO CAPS: 500.0000 mg | ORAL_CAPSULE | Freq: Three times a day (TID) | ORAL | Status: AC

## 2011-08-06 NOTE — Progress Notes (Signed)
History: Patient returns for followup of his subtotal colectomy complicated by a bowel obstruction requiring laparotomy and subsequent wound infection. He continues to have a fair amount of drainage from the wound and his wife up and a small area the upper wound that got red and swollen. We refilled his Keflex. The redness and swelling is better. He had a lot of diarrhea yesterday but generally has been having one bowel movement a day. Appetite is not great but he is eating without pain or vomiting.  Exam: BP 102/64  Pulse 100  Temp(Src) 97.4 F (36.3 C) (Temporal)  Ht 5' 11.5" (1.816 m)  Wt 169 lb 3.2 oz (76.749 kg)  BMI 23.27 kg/m2  SpO2 97%  Gen.: He appears a little weak but I think better than he looked last week. Abdomen: Generally soft and nontender. There is some purulent drainage from the open area of his lower wound and some necrotic debris at the base. There is a small open area in the upper wound was purulent drainage.  Under local anesthesia enlarge the opening in the lower wound and debrided some necrotic material at the level of the fascia. Also opened the upper portion of the wound for 2 or 3 cm and drained some more purulent material and this did not seem to track any further. His wife is comfortable changing the packing in shredded this again in the office today.  Wound culture showed Klebsiella and he is on Keflex which it is sensitive. We'll continue his other week.  Assessment and plan: Wound infection I think would benefit from further opening today. He'll call for any worsening symptoms otherwise return in one week.

## 2011-08-13 ENCOUNTER — Ambulatory Visit (INDEPENDENT_AMBULATORY_CARE_PROVIDER_SITE_OTHER): Payer: BC Managed Care – PPO | Admitting: General Surgery

## 2011-08-13 ENCOUNTER — Encounter (INDEPENDENT_AMBULATORY_CARE_PROVIDER_SITE_OTHER): Payer: Self-pay | Admitting: General Surgery

## 2011-08-13 VITALS — BP 108/76 | HR 68 | Temp 96.9°F | Resp 18 | Ht 71.5 in | Wt 169.4 lb

## 2011-08-13 DIAGNOSIS — Z09 Encounter for follow-up examination after completed treatment for conditions other than malignant neoplasm: Secondary | ICD-10-CM

## 2011-08-13 DIAGNOSIS — G8918 Other acute postprocedural pain: Secondary | ICD-10-CM

## 2011-08-13 MED ORDER — IBUPROFEN 600 MG PO TABS: 600.0000 mg | ORAL_TABLET | Freq: Four times a day (QID) | ORAL | Status: DC | PRN

## 2011-08-13 MED ORDER — TRAMADOL HCL 50 MG PO TABS: ORAL_TABLET | ORAL | Status: DC

## 2011-08-13 NOTE — Progress Notes (Signed)
History: This returns for followup. He overall is feeling better. He is eating without difficulty. Bowels are moving once or twice a day. His wound feels better although in the last few days they have noted a little more of a foul-smelling drainage.  Exam: BP 108/76  Pulse 68  Temp(Src) 96.9 F (36.1 C) (Temporal)  Resp 18  Ht 5' 11.5" (1.816 m)  Wt 169 lb 6.4 oz (76.839 kg)  BMI 23.30 kg/m2  Gen.: He appears stronger in no distress Abdomen: Flat soft and nontender Wound: The 2 open areas did have some cloudy foul-smelling drainage. I explored the base of each of the 2 open areas and there was a small amount of necrotic fascia at the base which I debris did and at the end the wounds appear pretty clean. They were repacked.  Assessment and plan: Status post subtotal colectomy. Wound infection, improving. Continue current wound care. I will see him in 2 weeks.  We're going to refer him to the cancer center for a discussion regarding chemotherapy although he is known negative and I do not believe that there is a definite indication.

## 2011-08-17 ENCOUNTER — Telehealth (INDEPENDENT_AMBULATORY_CARE_PROVIDER_SITE_OTHER): Payer: Self-pay

## 2011-08-17 ENCOUNTER — Encounter (INDEPENDENT_AMBULATORY_CARE_PROVIDER_SITE_OTHER): Payer: Self-pay

## 2011-08-17 NOTE — Telephone Encounter (Signed)
Mr. Odem called requesting a copy of Chris Erickson's CT imaging.  Patient would like it mailed to:  [address], [place], [place]

## 2011-08-27 ENCOUNTER — Encounter (INDEPENDENT_AMBULATORY_CARE_PROVIDER_SITE_OTHER): Payer: Self-pay | Admitting: General Surgery

## 2011-08-27 ENCOUNTER — Ambulatory Visit (INDEPENDENT_AMBULATORY_CARE_PROVIDER_SITE_OTHER): Payer: BC Managed Care – PPO | Admitting: General Surgery

## 2011-08-27 VITALS — BP 118/68 | HR 76 | Temp 98.0°F | Resp 16 | Ht 71.5 in | Wt 177.6 lb

## 2011-08-27 DIAGNOSIS — Z09 Encounter for follow-up examination after completed treatment for conditions other than malignant neoplasm: Secondary | ICD-10-CM

## 2011-08-27 DIAGNOSIS — C189 Malignant neoplasm of colon, unspecified: Secondary | ICD-10-CM

## 2011-08-27 NOTE — Progress Notes (Signed)
Patient returns for followup post colectomy and then reexploration for bowel obstruction. He is now 3 weeks postop. He had a wound infection and required his midline wound to be opened. He's feeling gradually much better. He is eating without difficulty. Bowel movements are loose as expected but do have a foul odor. No nausea or vomiting. The wound is gradually closing in and getting less painful.  Exam: Weight is 177 which is up another 6 pounds. General: Looks much better not unwell Abdomen wound is clean granulation. There is little undermining up toward the umbilicus from the lower open area with some slight purulent drainage but I cleaned this out with a Q-tip and this appears minimal and better than last visit.  Assessment and plan: Doing well with healing wound and weight increasing. He had a T3 N0 tumor and we'll obtain a consult from medical oncology although I don't think that adjuvant treatment is clearly indicated. I think he'll be off work another 3 weeks and he was returned in 2 weeks.

## 2011-08-30 ENCOUNTER — Ambulatory Visit (INDEPENDENT_AMBULATORY_CARE_PROVIDER_SITE_OTHER): Payer: BC Managed Care – PPO | Admitting: General Surgery

## 2011-08-30 DIAGNOSIS — Z09 Encounter for follow-up examination after completed treatment for conditions other than malignant neoplasm: Secondary | ICD-10-CM

## 2011-08-30 NOTE — Progress Notes (Signed)
Wound check.  Generally improving,  Small bridge of granulation divided. Cont current wound care

## 2011-08-31 ENCOUNTER — Telehealth: Payer: Self-pay | Admitting: Oncology

## 2011-08-31 NOTE — Telephone Encounter (Signed)
called pts home  04/08 and 04/09 s/w wife and she stated she would talk to pt about coming in for appt.

## 2011-09-01 ENCOUNTER — Telehealth: Payer: Self-pay | Admitting: *Deleted

## 2011-09-01 NOTE — Telephone Encounter (Signed)
Attempted to call patient twice without success.  Left message to call back and schedule appointment with Dr. Truett Perna.

## 2011-09-02 ENCOUNTER — Telehealth: Payer: Self-pay | Admitting: Oncology

## 2011-09-02 NOTE — Telephone Encounter (Signed)
received new pt package back called pts home third time lmovm to rtn call to schedule appt

## 2011-09-03 ENCOUNTER — Telehealth: Payer: Self-pay | Admitting: Oncology

## 2011-09-03 NOTE — Telephone Encounter (Signed)
no response from pt on 04/11 will fax a letter to Dr. Johna Sheriff and informed them pt did not want to scheduled at this time.

## 2011-09-17 ENCOUNTER — Encounter (INDEPENDENT_AMBULATORY_CARE_PROVIDER_SITE_OTHER): Payer: Self-pay | Admitting: General Surgery

## 2011-09-17 ENCOUNTER — Ambulatory Visit (INDEPENDENT_AMBULATORY_CARE_PROVIDER_SITE_OTHER): Payer: BC Managed Care – PPO | Admitting: General Surgery

## 2011-09-17 ENCOUNTER — Encounter (INDEPENDENT_AMBULATORY_CARE_PROVIDER_SITE_OTHER): Payer: BC Managed Care – PPO | Admitting: General Surgery

## 2011-09-17 VITALS — BP 147/86 | HR 80 | Temp 97.9°F | Resp 16 | Ht 71.5 in | Wt 187.0 lb

## 2011-09-17 DIAGNOSIS — C189 Malignant neoplasm of colon, unspecified: Secondary | ICD-10-CM

## 2011-09-17 DIAGNOSIS — C184 Malignant neoplasm of transverse colon: Secondary | ICD-10-CM

## 2011-09-17 NOTE — Progress Notes (Signed)
Patient returns for further followup post subtotal colectomy complicated by postop bowel obstruction requiring laparotomy and that wound infection. He continued to improve. He has no abdominal or GI complaints, eating well and having back weight. His wound is feeling much better.  Exam: BP 147/86  Pulse 80  Temp(Src) 97.9 F (36.6 C) (Temporal)  Resp 16  Ht 5' 11.5" (1.816 m)  Wt 187 lb (84.823 kg)  BMI 25.72 kg/m2  Gen.: Appears well Abdomen: Soft and nontender. He now does has a 1 cm x 1 cm x 1 cm clean open area of granulation at its inferior wound.  Assessment and plan: Doing well following subtotal colectomy. He has a cancer policy that did not cover him locally and he will be referred to [facility] for an oncology opinion. Return to work 2 weeks. Return here one month.

## 2011-10-22 ENCOUNTER — Ambulatory Visit (INDEPENDENT_AMBULATORY_CARE_PROVIDER_SITE_OTHER): Payer: BC Managed Care – PPO | Admitting: General Surgery

## 2011-10-22 ENCOUNTER — Encounter (INDEPENDENT_AMBULATORY_CARE_PROVIDER_SITE_OTHER): Payer: Self-pay | Admitting: General Surgery

## 2011-10-22 VITALS — BP 100/80 | HR 84 | Temp 97.8°F | Resp 14 | Ht 71.5 in | Wt 189.8 lb

## 2011-10-22 DIAGNOSIS — C184 Malignant neoplasm of transverse colon: Secondary | ICD-10-CM

## 2011-10-22 NOTE — Progress Notes (Signed)
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 He had postoperative bowel obstruction requiring laparotomy. He now is feeling well. He is an occasional aching under his left rib cage related to physical activity but is steadily gaining strength and back to normal activity.  The patient had an oncology followup at [facility]. They did not recommend chemotherapy. They recommended followup CT scans and close observation.  Exam: Gen.: He appears well. Abdomen: Wound is now well healed with just a tiny scab over a portion of the incision. Soft and nontender.  Assessment plan: Doing well post subtotal colectomy. Will plan followup visit in December with a CT scan prior to visit. Per [facility] recommendations will follow with CT q.6 months

## 2012-03-21 ENCOUNTER — Telehealth (INDEPENDENT_AMBULATORY_CARE_PROVIDER_SITE_OTHER): Payer: Self-pay

## 2012-03-21 ENCOUNTER — Other Ambulatory Visit (INDEPENDENT_AMBULATORY_CARE_PROVIDER_SITE_OTHER): Payer: Self-pay

## 2012-03-21 DIAGNOSIS — C189 Malignant neoplasm of colon, unspecified: Secondary | ICD-10-CM

## 2012-03-21 NOTE — Telephone Encounter (Signed)
CT scheduled for 04/21/12 @ 9:30 @ [facility] [address]  Patient will pick up contrast.

## 2012-04-21 ENCOUNTER — Ambulatory Visit
Admission: RE | Admit: 2012-04-21 | Discharge: 2012-04-21 | Disposition: A | Discharge status: Home / Self Care | Payer: BC Managed Care – PPO | Source: Ambulatory Visit | Attending: General Surgery | Admitting: General Surgery

## 2012-04-21 DIAGNOSIS — C189 Malignant neoplasm of colon, unspecified: Secondary | ICD-10-CM

## 2012-04-21 MED ORDER — IOHEXOL 300 MG/ML  SOLN
100.0000 mL | Freq: Once | INTRAMUSCULAR | Status: AC | PRN
  Administered 2012-04-21: 100 mL via INTRAVENOUS

## 2012-05-04 ENCOUNTER — Telehealth (INDEPENDENT_AMBULATORY_CARE_PROVIDER_SITE_OTHER): Payer: Self-pay

## 2012-05-04 ENCOUNTER — Encounter (INDEPENDENT_AMBULATORY_CARE_PROVIDER_SITE_OTHER): Payer: Self-pay

## 2012-05-04 NOTE — Telephone Encounter (Signed)
CT results given to patient.  Patient has follow up appointment on 05/05/12 @ 9:00 am w/Dr. Johna Sheriff.

## 2012-05-05 ENCOUNTER — Encounter (INDEPENDENT_AMBULATORY_CARE_PROVIDER_SITE_OTHER): Payer: Self-pay | Admitting: General Surgery

## 2012-05-05 ENCOUNTER — Ambulatory Visit (INDEPENDENT_AMBULATORY_CARE_PROVIDER_SITE_OTHER): Payer: BC Managed Care – PPO | Admitting: General Surgery

## 2012-05-05 VITALS — BP 132/74 | HR 73 | Temp 98.0°F | Resp 18 | Ht 72.0 in | Wt 199.0 lb

## 2012-05-05 DIAGNOSIS — C184 Malignant neoplasm of transverse colon: Secondary | ICD-10-CM

## 2012-05-05 LAB — CBC WITH DIFFERENTIAL/PLATELET
Basophils Relative: 1 % (ref 0–1)
Eosinophils Absolute: 0.3 10*3/uL (ref 0.0–0.7)
MCH: 30.3 pg (ref 26.0–34.0)
MCHC: 33.3 g/dL (ref 30.0–36.0)
Neutrophils Relative %: 50 % (ref 43–77)
Platelets: 304 10*3/uL (ref 150–400)
RDW: 13.2 % (ref 11.5–15.5)

## 2012-05-05 LAB — HEPATIC FUNCTION PANEL
AST: 31 U/L (ref 0–37)
Albumin: 4.8 g/dL (ref 3.5–5.2)
Total Bilirubin: 2.1 mg/dL — ABNORMAL HIGH (ref 0.3–1.2)

## 2012-05-05 NOTE — Progress Notes (Signed)
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 He had postoperative bowel obstruction requiring laparotomy.  He reports no problems in the office today. No significant abdominal pain. Eating without difficulty. 1-2 normal bowel movements per day without blood. The patient had an oncology followup at [facility]. They did not recommend chemotherapy. They recommended followup CT scans and close observation.   Exam:  BP 132/74  Pulse 73  Temp 98 F (36.7 C) (Temporal)  Resp 18  Ht 6' (1.829 m)  Wt 199 lb (90.266 kg)  BMI 26.99 kg/m2  Gen.: He appears well.  Lymph nodes: No cervical, subclavicular or inguinal nodes palpable Abdomen:soft and nontender. Incision well-healed without hernias. No masses or organomegaly.  Imaging: Recent CT scan of the abdomen and pelvis was entirely negative.  Assessment plan: Doing well post subtotal colectomy.  No evidence of recurrent cancer clinically. We will check CBC and liver function tests.  Per [facility] recommendations will follow with CT q.6 months.

## 2012-06-12 ENCOUNTER — Telehealth (INDEPENDENT_AMBULATORY_CARE_PROVIDER_SITE_OTHER): Payer: Self-pay

## 2012-06-13 ENCOUNTER — Telehealth (INDEPENDENT_AMBULATORY_CARE_PROVIDER_SITE_OTHER): Payer: Self-pay

## 2012-06-13 ENCOUNTER — Telehealth (INDEPENDENT_AMBULATORY_CARE_PROVIDER_SITE_OTHER): Payer: Self-pay | Admitting: General Surgery

## 2012-06-13 DIAGNOSIS — R945 Abnormal results of liver function studies: Secondary | ICD-10-CM

## 2012-06-13 NOTE — Telephone Encounter (Signed)
Spoke to patient wife Chris Erickson) regarding abnormal LFT's results.  Explained that this could be due to medication, alcohol consumption and we will redraw hepatic liver function test Friday at [facility] in [place], [place].  Will call patient with results

## 2012-06-13 NOTE — Telephone Encounter (Signed)
Patient's wife calling to speak with [facility]. She would not let me know what it was about and would not let me help her. She only stated "have Christy call me at her earliest convenience- she has my number," I told her I would pass the message to her.

## 2012-06-15 NOTE — Telephone Encounter (Signed)
Spoke to patient wife, made her aware that [facility] will not allow them to have labs drawn ordered by our office.  Patient will go to [facility] in [place], [place] on Friday.

## 2012-06-15 NOTE — Telephone Encounter (Signed)
Patient aware that he will need to go to [facility] to have his LFT's drawn.  Patient to go Friday 06/16/12 at the [place], [place] location.

## 2012-06-27 ENCOUNTER — Telehealth (INDEPENDENT_AMBULATORY_CARE_PROVIDER_SITE_OTHER): Payer: Self-pay

## 2012-06-27 NOTE — Telephone Encounter (Signed)
Called patient regarding lab test. Spoke to patient wife Chris Erickson) she reports that patient hasn't had his lab work for LFT's yet because he's been taking medication that has tylenol in it and feel's that will effect the results.  Chris Erickson (spouse) said she will call our office to make [place] aware when he has his labs drawn.

## 2012-07-01 LAB — HEPATIC FUNCTION PANEL
Albumin: 4.6 g/dL (ref 3.5–5.2)
Alkaline Phosphatase: 66 U/L (ref 39–117)
Indirect Bilirubin: 1.5 mg/dL — ABNORMAL HIGH (ref 0.0–0.9)
Total Bilirubin: 1.7 mg/dL — ABNORMAL HIGH (ref 0.3–1.2)

## 2012-07-11 ENCOUNTER — Telehealth (INDEPENDENT_AMBULATORY_CARE_PROVIDER_SITE_OTHER): Payer: Self-pay | Admitting: *Deleted

## 2012-07-11 NOTE — Telephone Encounter (Signed)
Wife called in to request results of hepatic function tests.  [facility]worth MD has not reviewed results at this time.  Chris Erickson is aware and will call patient once [facility] MD reviews results.

## 2012-07-13 ENCOUNTER — Telehealth (INDEPENDENT_AMBULATORY_CARE_PROVIDER_SITE_OTHER): Payer: Self-pay

## 2012-07-13 NOTE — Telephone Encounter (Signed)
Wife called again for hepatic function results.  Explained to wife that AST and ALT were within normal limits.

## 2012-07-13 NOTE — Telephone Encounter (Signed)
Called with Liver Function Lab results.  Spoke to patient spouse Alvino Chapel) results given.  They would like a copy mailed to them.

## 2012-07-13 NOTE — Telephone Encounter (Signed)
Message copied by Maryan Puls on Thu Jul 13, 2012  3:19 PM ------      Message from: Debbe Odea R      Created: Mon Jul 10, 2012  1:32 PM      Regarding: please call       Ignatz, Deis, pt's wife, called to speak with you and would not let me help her.  She requests you call her at [phone].  (I told her you were at lunch.)  bp ------

## 2012-07-14 ENCOUNTER — Telehealth (INDEPENDENT_AMBULATORY_CARE_PROVIDER_SITE_OTHER): Payer: Self-pay

## 2012-07-14 NOTE — Telephone Encounter (Signed)
Patient requested copy of lab results be mailed.  Mailed out on 07/14/12.

## 2012-08-31 ENCOUNTER — Other Ambulatory Visit (INDEPENDENT_AMBULATORY_CARE_PROVIDER_SITE_OTHER): Payer: Self-pay

## 2012-08-31 ENCOUNTER — Ambulatory Visit (INDEPENDENT_AMBULATORY_CARE_PROVIDER_SITE_OTHER): Payer: BC Managed Care – PPO

## 2012-08-31 ENCOUNTER — Inpatient Hospital Stay (HOSPITAL_COMMUNITY)
Admission: EM | Admit: 2012-08-31 | Discharge: 2012-09-01 | DRG: 181 | Discharge status: Against Medical Advice | Payer: BC Managed Care – PPO | Attending: General Surgery | Admitting: General Surgery

## 2012-08-31 ENCOUNTER — Encounter (HOSPITAL_COMMUNITY): Payer: Self-pay | Admitting: Emergency Medicine

## 2012-08-31 ENCOUNTER — Emergency Department (HOSPITAL_COMMUNITY): Payer: BC Managed Care – PPO

## 2012-08-31 ENCOUNTER — Telehealth (INDEPENDENT_AMBULATORY_CARE_PROVIDER_SITE_OTHER): Payer: Self-pay

## 2012-08-31 ENCOUNTER — Other Ambulatory Visit (INDEPENDENT_AMBULATORY_CARE_PROVIDER_SITE_OTHER): Payer: Self-pay | Admitting: *Deleted

## 2012-08-31 DIAGNOSIS — K560 Paralytic ileus: Principal | ICD-10-CM | POA: Diagnosis present

## 2012-08-31 DIAGNOSIS — R197 Diarrhea, unspecified: Secondary | ICD-10-CM

## 2012-08-31 DIAGNOSIS — Z87891 Personal history of nicotine dependence: Secondary | ICD-10-CM

## 2012-08-31 DIAGNOSIS — Z85038 Personal history of other malignant neoplasm of large intestine: Secondary | ICD-10-CM

## 2012-08-31 DIAGNOSIS — Z9049 Acquired absence of other specified parts of digestive tract: Secondary | ICD-10-CM

## 2012-08-31 DIAGNOSIS — Z885 Allergy status to narcotic agent status: Secondary | ICD-10-CM

## 2012-08-31 DIAGNOSIS — R630 Anorexia: Secondary | ICD-10-CM

## 2012-08-31 DIAGNOSIS — K5289 Other specified noninfective gastroenteritis and colitis: Secondary | ICD-10-CM | POA: Diagnosis present

## 2012-08-31 DIAGNOSIS — R109 Unspecified abdominal pain: Secondary | ICD-10-CM

## 2012-08-31 DIAGNOSIS — Z860100 Personal history of colon polyps, unspecified: Secondary | ICD-10-CM

## 2012-08-31 DIAGNOSIS — Z91040 Latex allergy status: Secondary | ICD-10-CM

## 2012-08-31 LAB — CBC WITH DIFFERENTIAL/PLATELET
Basophils Absolute: 0 10*3/uL (ref 0.0–0.1)
Eosinophils Relative: 0 % (ref 0–5)
Lymphocytes Relative: 11 % — ABNORMAL LOW (ref 12–46)
MCV: 84.8 fL (ref 78.0–100.0)
Neutro Abs: 12.4 10*3/uL — ABNORMAL HIGH (ref 1.7–7.7)
Neutrophils Relative %: 80 % — ABNORMAL HIGH (ref 43–77)
Platelets: 338 10*3/uL (ref 150–400)
RDW: 12.7 % (ref 11.5–15.5)
WBC: 15.4 10*3/uL — ABNORMAL HIGH (ref 4.0–10.5)

## 2012-08-31 LAB — BASIC METABOLIC PANEL
CO2: 25 mEq/L (ref 19–32)
Calcium: 10.4 mg/dL (ref 8.4–10.5)
Sodium: 133 mEq/L — ABNORMAL LOW (ref 135–145)

## 2012-08-31 MED ORDER — HEPARIN SODIUM (PORCINE) 5000 UNIT/ML IJ SOLN
5000.0000 [IU] | Freq: Three times a day (TID) | INTRAMUSCULAR | Status: DC
  Administered 2012-08-31: 5000 [IU] via SUBCUTANEOUS
  Filled 2012-08-31 (×2): qty 1

## 2012-08-31 MED ORDER — IOHEXOL 300 MG/ML  SOLN: 50.0000 mL | Freq: Once | INTRAMUSCULAR | Status: AC | PRN

## 2012-08-31 MED ORDER — ONDANSETRON HCL 4 MG/2ML IJ SOLN: 4.0000 mg | Freq: Four times a day (QID) | INTRAMUSCULAR | Status: DC | PRN

## 2012-08-31 MED ORDER — ONDANSETRON HCL 4 MG/2ML IJ SOLN
4.0000 mg | Freq: Once | INTRAMUSCULAR | Status: AC
  Administered 2012-08-31: 4 mg via INTRAVENOUS

## 2012-08-31 MED ORDER — BIOTENE DRY MOUTH MT LIQD: 15.0000 mL | Freq: Two times a day (BID) | OROMUCOSAL | Status: DC

## 2012-08-31 MED ORDER — DIPHENHYDRAMINE HCL 50 MG/ML IJ SOLN: 12.5000 mg | Freq: Four times a day (QID) | INTRAMUSCULAR | Status: DC | PRN

## 2012-08-31 MED ORDER — ONDANSETRON HCL 4 MG/2ML IJ SOLN
4.0000 mg | Freq: Once | INTRAMUSCULAR | Status: AC
  Administered 2012-08-31: 4 mg via INTRAVENOUS
  Filled 2012-08-31: qty 2

## 2012-08-31 MED ORDER — DIPHENHYDRAMINE HCL 12.5 MG/5ML PO ELIX: 12.5000 mg | ORAL_SOLUTION | Freq: Four times a day (QID) | ORAL | Status: DC | PRN

## 2012-08-31 MED ORDER — ONDANSETRON HCL 4 MG/2ML IJ SOLN
INTRAMUSCULAR | Status: AC
  Filled 2012-08-31: qty 2

## 2012-08-31 MED ORDER — SODIUM CHLORIDE 0.9 % IV SOLN
INTRAVENOUS | Status: DC
  Administered 2012-08-31: 17:00:00 via INTRAVENOUS

## 2012-08-31 MED ORDER — IOHEXOL 300 MG/ML  SOLN
100.0000 mL | Freq: Once | INTRAMUSCULAR | Status: AC | PRN
  Administered 2012-08-31: 100 mL via INTRAVENOUS

## 2012-08-31 MED ORDER — HYDROMORPHONE HCL PF 1 MG/ML IJ SOLN
0.5000 mg | INTRAMUSCULAR | Status: DC | PRN
  Administered 2012-08-31: 1 mg via INTRAVENOUS
  Filled 2012-08-31: qty 1

## 2012-08-31 MED ORDER — MORPHINE SULFATE 4 MG/ML IJ SOLN
4.0000 mg | Freq: Once | INTRAMUSCULAR | Status: AC
  Administered 2012-08-31: 4 mg via INTRAVENOUS
  Filled 2012-08-31: qty 1

## 2012-08-31 MED ORDER — CHLORHEXIDINE GLUCONATE 0.12 % MT SOLN
15.0000 mL | Freq: Two times a day (BID) | OROMUCOSAL | Status: DC
  Administered 2012-08-31: 15 mL via OROMUCOSAL
  Filled 2012-08-31 (×2): qty 15

## 2012-08-31 MED ORDER — KCL IN DEXTROSE-NACL 20-5-0.45 MEQ/L-%-% IV SOLN
INTRAVENOUS | Status: DC
  Administered 2012-08-31: 125 mL via INTRAVENOUS
  Filled 2012-08-31 (×2): qty 1000

## 2012-08-31 NOTE — ED Provider Notes (Signed)
Medical screening examination/treatment/procedure(s) were performed by non-physician practitioner and as supervising physician I was immediately available for consultation/collaboration.   Nelia Shi, MD 08/31/12 [phone]

## 2012-08-31 NOTE — Telephone Encounter (Addendum)
Incoming call from patient wife Chris Erickson to report that patient has been in severe LUQ pain, no appetite, vomiting one time & diarrhea, no fever to report.  Advised that Dr. Johna Sheriff is not available the rest of the week.  Due to patient's history of SBO, Lap Colectomy and diagnosis of Colon Cancer I advised Chris Erickson that he should be taking to the ED for further work-up.  Patient spouse called back and states that Chris Erickson did not want to wait in the ED.  Reviewed with Dr. Corliss Skains and a CT abd has been ordered as well as a CBCw/Diff & CMET.  Patient going to have CT today @ 1:00 pm and will have labs drawn at that time.  We will await results and notify patient once we have rec'd them.

## 2012-08-31 NOTE — ED Provider Notes (Signed)
History     CSN: [id number]  Arrival date & time 08/31/12  1608   First MD Initiated Contact with Patient 08/31/12 1616      Chief Complaint  Patient presents with  . bowel obstruction     (Consider location/radiation/quality/duration/timing/severity/associated sxs/prior treatment) HPI Comments: Pt presents to the ED from PCP office for further evaluation of possible bowel obstruction.  Pt endorses 4 days of generalized abdominal discomfort associated with intermittent sharp abdominal pains, nausea, and watery diarrhea.  Pt denies any active vomiting.  Has not been eating or drinking regularly due to the pain- no PO intake today.  CT earlier today revealed possible partial SBO vs. Adynamic ileus.  Pt denies any fever, chills, sweats, dysuria, hematuria, or hematochezia.  Pt is s/p sub-total colectomy for colon cancer 07/06/11 with revision 07/19/11 due to SBO.  The history is provided by the patient.    Past Medical History  Diagnosis Date  . Colon polyp   . Colon polyps   . Cold     cold, nonproductive cough last week or so    Past Surgical History  Procedure Laterality Date  . Colonscopy  jan 2013  . Colon surgery    . Laparoscopy  07/20/2011    Procedure: LAPAROSCOPY DIAGNOSTIC;  Surgeon: Mariella Saa, MD;  Location: [facility];  Service: General;  Laterality: N/A;  . Laparotomy  07/20/2011    Procedure: EXPLORATORY LAPAROTOMY;  Surgeon: Mariella Saa, MD;  Location: [facility];  Service: General;  Laterality: N/A;    Family History  Problem Relation Age of Onset  . Cancer Father     asbestos  . Cancer Sister     breast  . Cancer Brother     colon    History  Substance Use Topics  . Smoking status: Former Smoker -- 1.00 packs/day for 15 years    Types: Cigarettes    Quit date: 05/24/2000  . Smokeless tobacco: Current User  . 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  All other systems reviewed and are negative.    Allergies  Latex; Vicodin; and Adhesive  Home Medications   Current Outpatient Rx  Name  Route  Sig  Dispense  Refill  . aspirin EC 81 MG tablet   Oral   Take 81 mg by mouth every evening.           BP 133/95  Pulse 119  Temp(Src) 97.9 F (36.6 C) (Oral)  Resp 21  SpO2 97%  Physical Exam  Nursing note and vitals reviewed. Constitutional: He is oriented to person, place, and time. He appears well-developed and well-nourished.  HENT:  Head: Normocephalic and atraumatic.  Mouth/Throat: Oropharynx is clear and moist.  Eyes: Conjunctivae and EOM are normal. Pupils are equal, round, and reactive to light.  Neck: Normal range of motion.  Cardiovascular: Normal rate, regular rhythm and normal heart sounds.   Pulmonary/Chest: Effort normal and breath sounds normal.  Abdominal: Soft. Bowel sounds are normal. He exhibits distension. There is generalized tenderness. There is no CVA tenderness, no tenderness at McBurney's point and negative Murphy's sign.  Midline incision from prior abdominal surgery  Musculoskeletal: Normal range of motion.  Neurological: He is alert and oriented to person, place, and time.  Skin: Skin is warm and dry.  Psychiatric: He has a normal mood and  affect.    ED Course  Procedures (including critical care time)  Labs Reviewed  CBC WITH DIFFERENTIAL - Abnormal; Notable for the following:    WBC 15.4 (*)    RBC 6.13 (*)    Hemoglobin 18.9 (*)    MCHC 36.3 (*)    Neutrophils Relative 80 (*)    Neutro Abs 12.4 (*)    Lymphocytes Relative 11 (*)    Monocytes Absolute 1.2 (*)    All other components within normal limits  BASIC METABOLIC PANEL - Abnormal; Notable for the following:    Sodium 133 (*)    Potassium 5.2 (*)    Chloride 93 (*)    Glucose, Bld 130 (*)    GFR calc non Af Amer 69 (*)    GFR calc Af Amer 80 (*)    All other components  within normal limits  URINALYSIS, ROUTINE W REFLEX MICROSCOPIC  BASIC METABOLIC PANEL  CBC   Ct Abdomen Pelvis W Contrast  08/31/2012  *RADIOLOGY REPORT*  Clinical Data: Abdominal pain.  Possible small bowel obstruction. Symptoms since Monday.  History colon cancer surgery 1 year ago.  CT ABDOMEN AND PELVIS WITH CONTRAST  Technique:  Multidetector CT imaging of the abdomen and pelvis was performed following the standard protocol during bolus administration of intravenous contrast.  Contrast: OMNIPAQUE IOHEXOL 300 MG/ML  SOLN  Comparison: 04/21/2012  Findings: Lung bases:  Clear lung bases. Normal heart size without pericardial or pleural effusion.  Mild left hemidiaphragm elevation.  Abdomen/pelvis:  Normal liver, spleen, stomach, pancreas, gallbladder, biliary tract, adrenal glands, kidneys.  No retroperitoneal or retrocrural adenopathy.  There has been a sub total colectomy.   Proximal most small bowel loops are normal in caliber.  There are mid small bowel loops which are moderately dilated, measuring up to 4.8 cm.  This dilatation continues throughout.  No focal transition point is identified. There is no evidence of bowel wall thickening, pneumatosis, mesenteric edema, or free intraperitoneal air.  Please note that the lower rectum/anus is partially excluded from the study. No pelvic adenopathy.    Normal urinary bladder and prostate.  No evidence of omental or peritoneal disease.  Bones/Musculoskeletal:  No acute osseous abnormality.  IMPRESSION:  1.  Status post sub total colectomy.  Moderate dilated, fluid- filled bowel from the level of the mid jejunum distally.  No well- defined transition point identified.  Suspicious for low grade obstruction, presumably secondary to adhesions. Adynamic ileus is felt less likely, but cannot be excluded, given absence of transition point. 2.  Please note that the lower rectum/anus is partially excluded. Far distal obstruction cannot be entirely excluded. 3.   No evidence of ischemia or other acute complication.  These results will be called to the ordering clinician or representative by the Radiologist Assistant, and communication documented in the PACS Dashboard.   Original Report Authenticated By: Jeronimo Greaves, M.D.      No diagnosis found.    MDM   Pt presenting to the ED from PCP for possible SBO.  CT scan earlier as above- partial SBO vs adynamic ileus.  Leukocytosis as 15.4.  Pt made NPO with IVF, pain and nausea controlled.   Consulted general surgery.  Barnetta Chapel, gen surg PA-C evaluated in the ED.  Pt will be admitted to [facility]- MD.     Garlon Hatchet, PA-C 08/31/12 2134

## 2012-08-31 NOTE — Telephone Encounter (Signed)
Called and spoke to patients wife Alvino Chapel) regarding CT results.  Ct findings are suspicious for Low Grade Obstruction.  Patient is having an increase in LUQ pain and states that it has increased over the last hour or two.  I advised patient to go to the ED for further work up and pain control.  Both patient and spouse agreed and will go to [facility] ED.  Tresa Endo, PA for [facility] has been paged and aware that patient is is on the way to [facility] ED.

## 2012-08-31 NOTE — H&P (Signed)
Chris Erickson 1961/02/14  [id number].   Chief Complaint/Reason for Consult: abdominal pain, distention HPI: this is a 52 yo male s/p subtotal abdominal colectomy for colon Ca 14 months ago.  He had a SBO 2 weeks post op that required ex lap.  Patient has done well since then.  He got a GI virus from work on Monday.  He started having N/V/D.  His abdomen has becoming increasingly distended and painful.  He has been unable to keep much po intake down.  He called our office today and was sent for a CT scan which revealed dilated SB with no definite transition point.  He has been referred to the [facility] where I am now seeing him.   Review of Systems: Please see HPI, otherwise negative  Family History  Problem Relation Age of Onset  . Cancer Father     asbestos  . Cancer Sister     breast  . Cancer Brother     colon    Past Medical History  Diagnosis Date  . Colon polyp   . Colon polyps   . Cold     cold, nonproductive cough last week or so    Past Surgical History  Procedure Laterality Date  . Colonscopy  jan 2013  . Colon surgery    . Laparoscopy  07/20/2011    Procedure: LAPAROSCOPY DIAGNOSTIC;  Surgeon: Mariella Saa, MD;  Location: [facility];  Service: General;  Laterality: N/A;  . Laparotomy  07/20/2011    Procedure: EXPLORATORY LAPAROTOMY;  Surgeon: Mariella Saa, MD;  Location: [facility];  Service: General;  Laterality: N/A;    Social History:  reports that he quit smoking about 12 years ago. His smoking use included Cigarettes. He has a 15 pack-year smoking history. He uses smokeless tobacco. He reports that he does not drink alcohol or use illicit drugs.  Allergies:  Allergies  Allergen Reactions  . Latex     rash  . Vicodin (Hydrocodone-Acetaminophen) Other (See Comments)    Sweating, dizzy, nausea, near syncope  . Adhesive (Tape) Rash     (Not in a hospital admission)  Blood pressure 133/95, pulse 119, temperature 97.9 F (36.6 C), temperature  source Oral, resp. rate 21, SpO2 97.00%. Physical Exam: General: pleasant, WD, WN white male who is laying in bed in NAD HEENT: head is normocephalic, atraumatic.  Sclera are noninjected.  PERRL.  Ears and nose without any masses or lesions.  Mouth is pink and moist Heart: regular, rate, and rhythm.  Normal s1,s2. No obvious murmurs, gallops, or rubs noted.  Palpable radial and pedal pulses bilaterally Lungs: CTAB, no wheezes, rhonchi, or rales noted.  Respiratory effort nonlabored Abd: soft, mild lower abdominal tenderness, distention with some tympany, few BS, no masses, hernias, or organomegaly, old midline scar noted MS: all 4 extremities are symmetrical with no cyanosis, clubbing, or edema. Skin: warm and dry with no masses, lesions, or rashes Psych: A&Ox3 with an appropriate affect.    No results found for this or any previous visit (from the past 48 hour(s)). Ct Abdomen Pelvis W Contrast  08/31/2012  *RADIOLOGY REPORT*  Clinical Data: Abdominal pain.  Possible small bowel obstruction. Symptoms since Monday.  History colon cancer surgery 1 year ago.  CT ABDOMEN AND PELVIS WITH CONTRAST  Technique:  Multidetector CT imaging of the abdomen and pelvis was performed following the standard protocol during bolus administration of intravenous contrast.  Contrast: OMNIPAQUE IOHEXOL 300 MG/ML  SOLN  Comparison:  04/21/2012  Findings: Lung bases:  Clear lung bases. Normal heart size without pericardial or pleural effusion.  Mild left hemidiaphragm elevation.  Abdomen/pelvis:  Normal liver, spleen, stomach, pancreas, gallbladder, biliary tract, adrenal glands, kidneys.  No retroperitoneal or retrocrural adenopathy.  There has been a sub total colectomy.   Proximal most small bowel loops are normal in caliber.  There are mid small bowel loops which are moderately dilated, measuring up to 4.8 cm.  This dilatation continues throughout.  No focal transition point is identified. There is no evidence of  bowel wall thickening, pneumatosis, mesenteric edema, or free intraperitoneal air.  Please note that the lower rectum/anus is partially excluded from the study. No pelvic adenopathy.    Normal urinary bladder and prostate.  No evidence of omental or peritoneal disease.  Bones/Musculoskeletal:  No acute osseous abnormality.  IMPRESSION:  1.  Status post sub total colectomy.  Moderate dilated, fluid- filled bowel from the level of the mid jejunum distally.  No well- defined transition point identified.  Suspicious for low grade obstruction, presumably secondary to adhesions. Adynamic ileus is felt less likely, but cannot be excluded, given absence of transition point. 2.  Please note that the lower rectum/anus is partially excluded. Far distal obstruction cannot be entirely excluded. 3.  No evidence of ischemia or other acute complication.  These results will be called to the ordering clinician or representative by the Radiologist Assistant, and communication documented in the PACS Dashboard.   Original Report Authenticated By: Jeronimo Greaves, M.D.        Assessment/Plan 1. Suspect adynamic ileus, secondary to gastroenteritis, vs PSBO 2. H/o colon CA, s/p subtotal abdominal colectomy  Plan: 1. Will plan on admission with placement of an NGT for decompression and conservative management.  This has been d/w the patient and his wife who are agreeable.  I do not see any need for emergent surgical intervention at this point.  We will check labs in the morning and repeat abdominal films in the morning as well.  He has labs pending from [place] currently.  Gwendlyon Zumbro E 08/31/2012, 4:34 PM Pager: [phone]

## 2012-08-31 NOTE — Progress Notes (Signed)
Patient refuses NGT insertion and SCD'S .  Patient states  " I 'm leaving ,they tried to do a xray downstairs and  They didn't know Chris Erickson had a xray done,nobody here knows what they're doing-  The doctor told me I didn't need that tube put down and nobody 's going to put one down,I was told I 'd get antibiotics and I don't have any antibiotics, I  want to see a doctor in here now.I'm 52 years old and I'm leaving.i'm  never coming back here " DR Ezzard Standing notified of the above,AC[ administrative coordinator] notified of the above.

## 2012-08-31 NOTE — H&P (Signed)
Patient seen and examined.  Agree with PA's note.  

## 2012-08-31 NOTE — ED Notes (Signed)
Pt sent over from PCP to be evaluated for possible small bowel obstruction.  Pt c/o 8/10 abdominal pain x4 days.

## 2012-09-01 LAB — COMPREHENSIVE METABOLIC PANEL
Albumin: 5.5 g/dL — ABNORMAL HIGH (ref 3.5–5.2)
BUN: 22 mg/dL (ref 6–23)
CO2: 19 mEq/L (ref 19–32)
Calcium: 10.2 mg/dL (ref 8.4–10.5)
Chloride: 97 mEq/L (ref 96–112)
Glucose, Bld: 107 mg/dL — ABNORMAL HIGH (ref 70–99)
Potassium: 4.1 mEq/L (ref 3.5–5.3)

## 2012-09-01 LAB — CBC WITH DIFFERENTIAL/PLATELET
Basophils Relative: 0 % (ref 0–1)
HCT: 51.1 % (ref 39.0–52.0)
Hemoglobin: 18.4 g/dL — ABNORMAL HIGH (ref 13.0–17.0)
MCHC: 36 g/dL (ref 30.0–36.0)
Monocytes Absolute: 1.3 10*3/uL — ABNORMAL HIGH (ref 0.1–1.0)
Monocytes Relative: 10 % (ref 3–12)
Neutro Abs: 8.6 10*3/uL — ABNORMAL HIGH (ref 1.7–7.7)

## 2012-09-01 NOTE — Care Management Note (Signed)
    Page 1 of 1   09/01/2012     8:52:08 AM   CARE MANAGEMENT NOTE 09/01/2012  Patient:  Chris Erickson, Chris Erickson   Account Number:  [id number]  Date Initiated:  09/01/2012  Documentation initiated by:  Lorenda Ishihara  Subjective/Objective Assessment:     Action/Plan:   Anticipated DC Date:  09/01/2012   Anticipated DC Plan:  HOME/SELF CARE         Choice offered to / List presented to:             Status of service:  Completed, signed off Medicare Important Message given?   (If response is "NO", the following Medicare IM given date fields will be blank) Date Medicare IM given:   Date Additional Medicare IM given:    Discharge Disposition:  AGAINST MEDICAL ADVICE  Per UR Regulation:  Reviewed for med. necessity/level of care/duration of stay  If discussed at Long Length of Stay Meetings, dates discussed:    Comments:

## 2012-09-01 NOTE — Progress Notes (Signed)
Patient . left AMA ,form given and signed.

## 2012-09-04 ENCOUNTER — Telehealth (INDEPENDENT_AMBULATORY_CARE_PROVIDER_SITE_OTHER): Payer: Self-pay | Admitting: General Surgery

## 2012-09-04 NOTE — Telephone Encounter (Signed)
I called patient and talked to his wife. He did not want to come to the phone. She states she is feeling somewhat better. I urged him to come into the office and see me in the next couple of days but at this point he says he has to work. I told his wife to please try to get him into the office to see me as soon as possible and to please call me at any point if she feels like he is doing any worse.

## 2012-09-13 NOTE — Discharge Summary (Signed)
Patient ID: Chris Erickson MRN: [id number] DOB/AGE: 1961-03-31 52 y.o.  Admit date: 08/31/2012 Discharge date: 09/01/2012  Procedures: None  Consults: None  Reason for Admission: this is a 52 yo male s/p subtotal abdominal colectomy for colon Ca 14 months ago. He had a SBO 2 weeks post op that required ex lap. Patient has done well since then. He got a GI virus from work on Monday. He started having N/V/D. His abdomen has becoming increasingly distended and painful. He has been unable to keep much po intake down. He called our office today and was sent for a CT scan which revealed dilated SB with no definite transition point. He has been referred to the [facility] where I am now seeing him.    Admission Diagnoses:  1. Suspect adynamic ileus, secondary to gastroenteritis, vs PSBO  2. H/o colon CA, s/p subtotal abdominal colectomy   Hospital Course: the patient was admitted.  Please see nursing notes for activity during the night.  Apparently , the patient was upset and left AMA the following morning.  Discharge Diagnoses:  Principal Problem:   Adynamic ileus   Discharge Medications:   Medication List    ASK your doctor about these medications       aspirin EC 81 MG tablet  Take 81 mg by mouth every evening.        Discharge Instructions: the patient left AMA.  No d/c instructions were able to be given.  Signed: Letha Cape 09/13/2012, 1:55 PM

## 2012-09-14 ENCOUNTER — Encounter (INDEPENDENT_AMBULATORY_CARE_PROVIDER_SITE_OTHER): Payer: Self-pay

## 2012-09-14 ENCOUNTER — Telehealth (INDEPENDENT_AMBULATORY_CARE_PROVIDER_SITE_OTHER): Payer: Self-pay

## 2012-09-14 NOTE — Discharge Summary (Signed)
Agree with summary. 

## 2012-09-14 NOTE — Telephone Encounter (Signed)
Letter regarding Pro-Biotic medication mailed to patient home address as listed in EPIC.

## 2013-05-04 IMAGING — CR DG ABDOMEN ACUTE W/ 1V CHEST
4 series · 4 of 4 positions shown · non-contrast
Comparison: None.

CLINICAL DATA: Colectomy 1 week ago, nausea and vomiting

ACUTE ABDOMEN SERIES (ABDOMEN 2 VIEW & CHEST 1 VIEW)

[w chest pa]
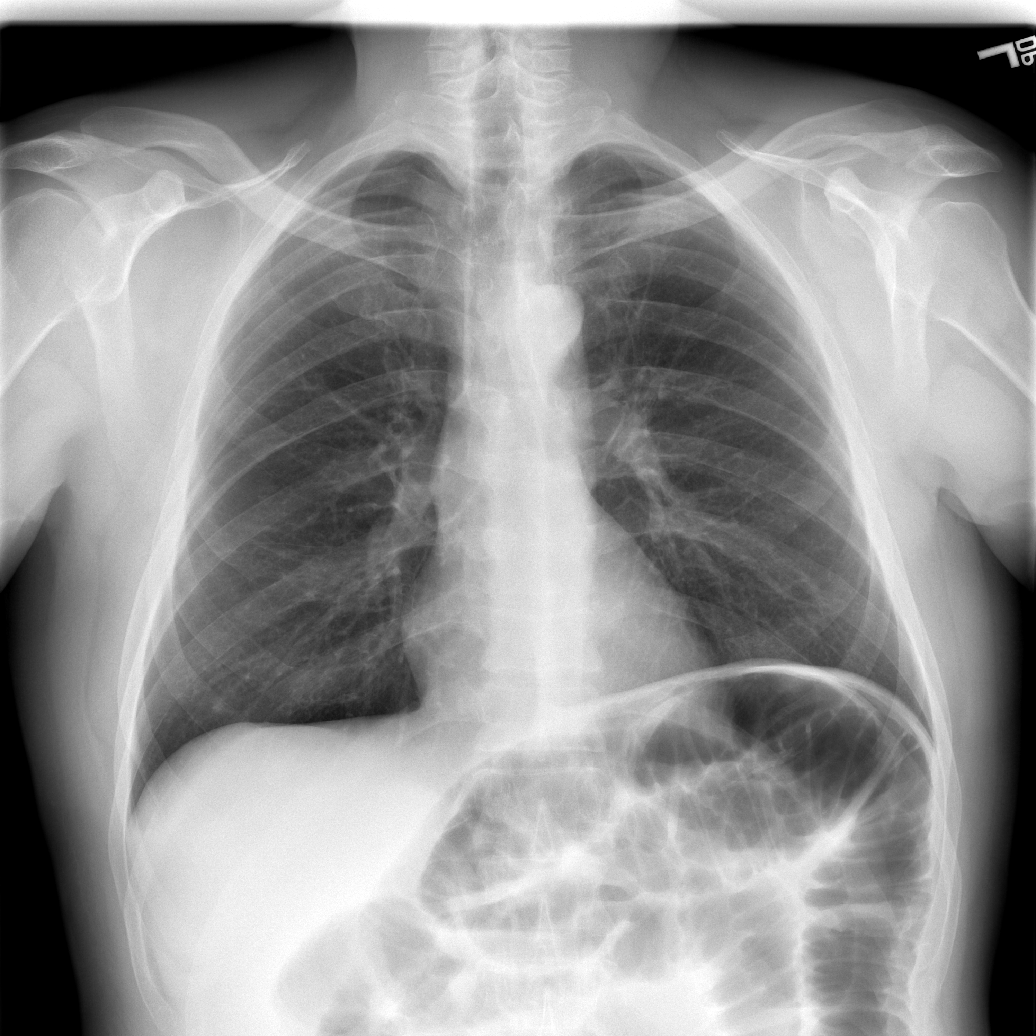

[w abdomen upright *]
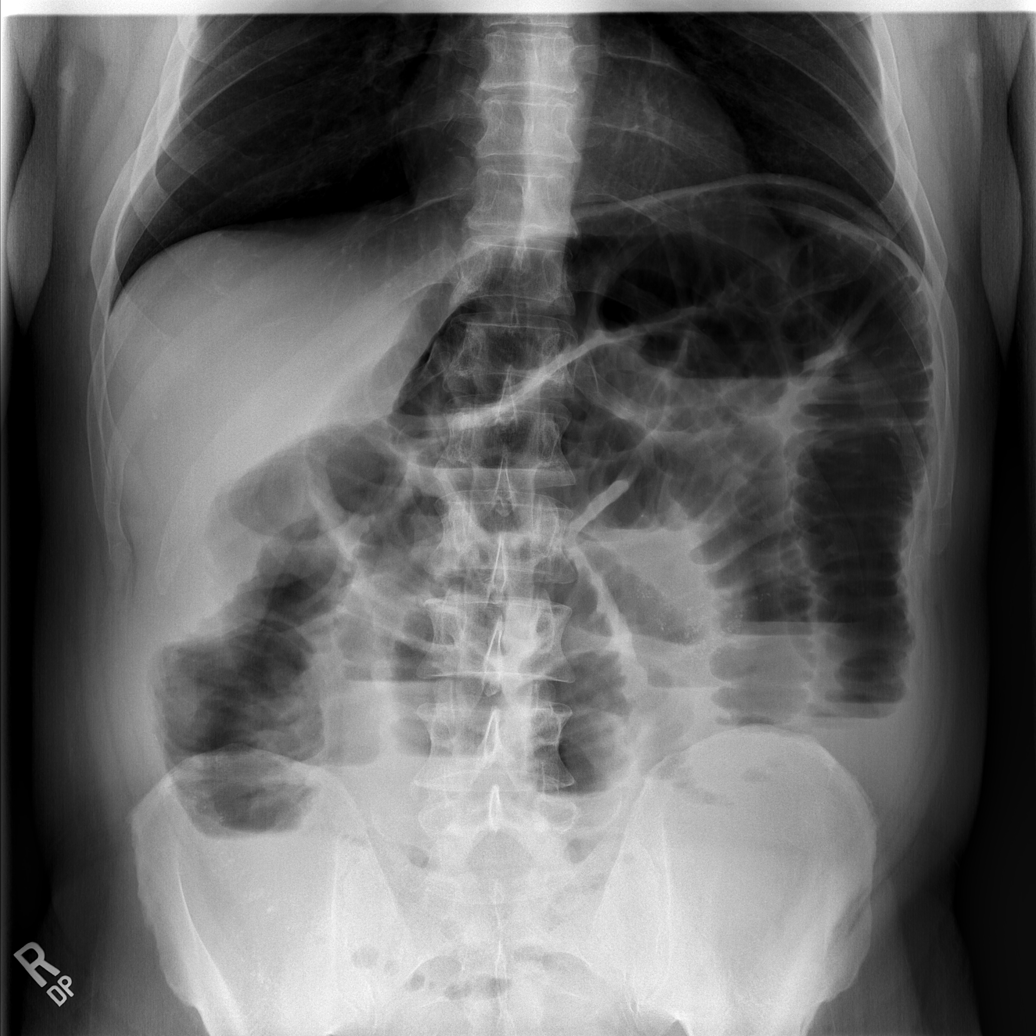

[t abdomen supine (1 of 2)]
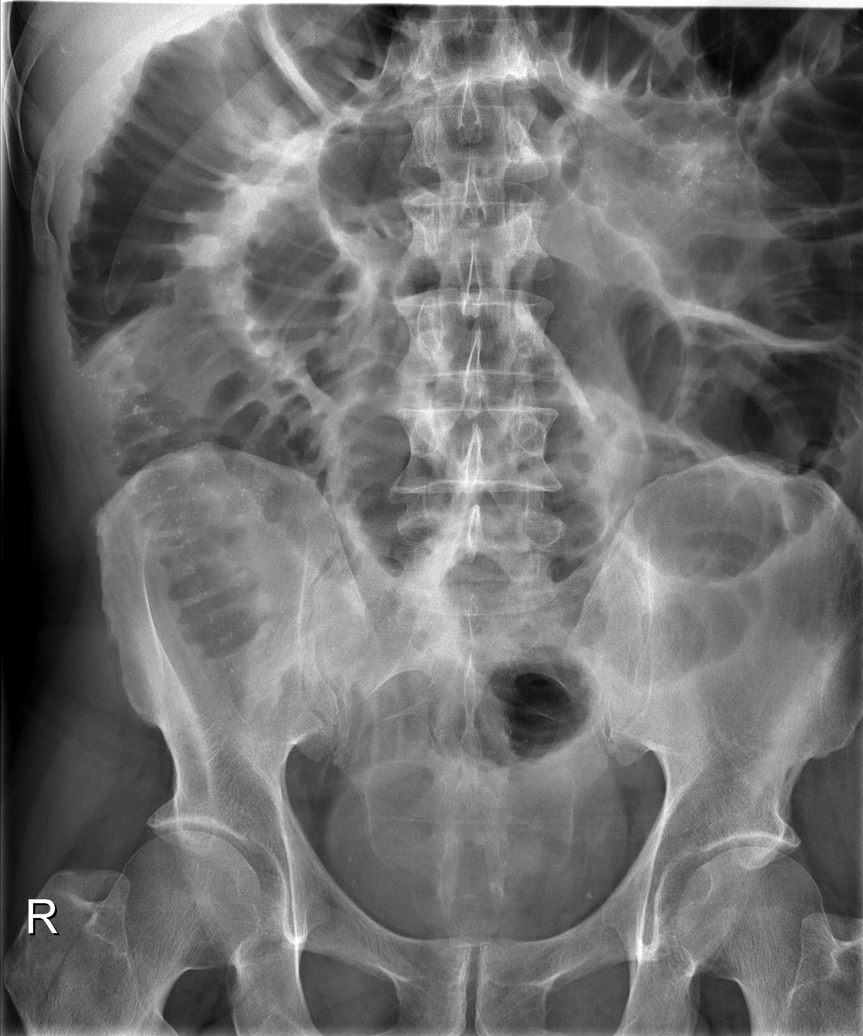

[t abdomen supine (2 of 2)]
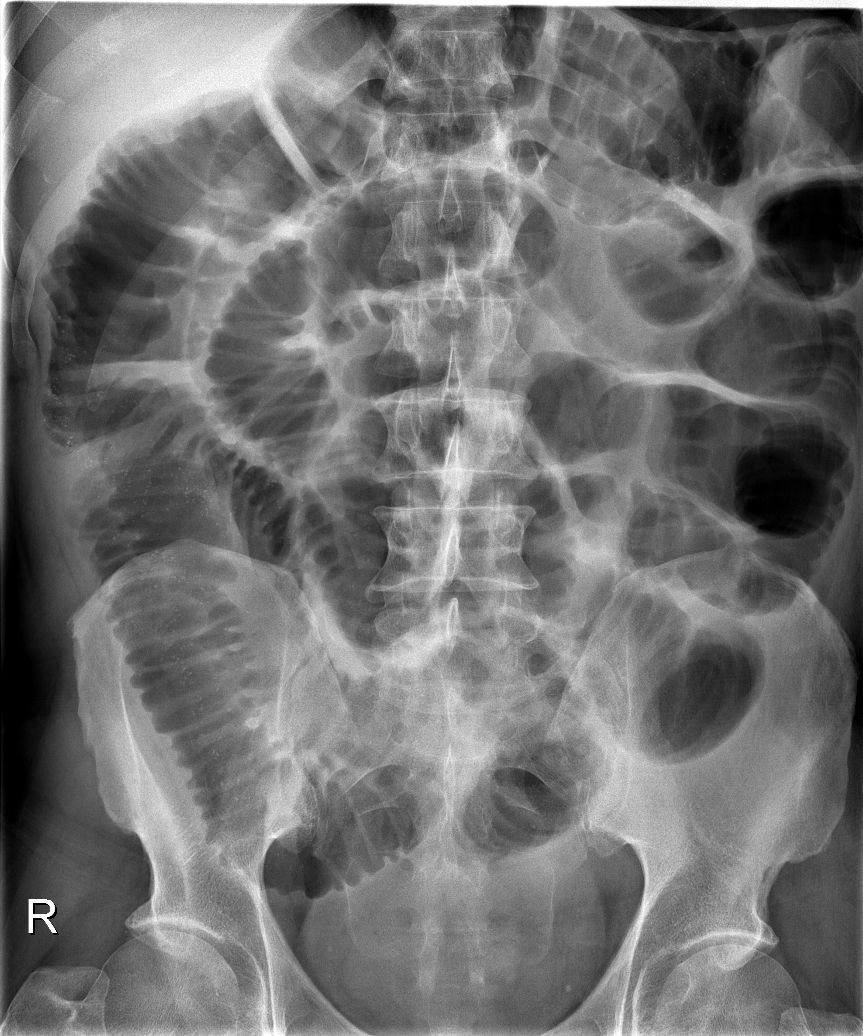

[4 of 4 positions shown; findings below may reference images not displayed]

FINDINGS: The lungs are clear.  Mediastinal contours appear normal.
The heart is within normal limits in size.  No bony abnormality is
seen.

Supine erect views of the abdomen show very dilated loops of small
bowel with air-fluid levels.  This pattern is consistent with small
bowel obstruction.  Small bowel loops are dilated up to 53 mm in
diameter.  No free intraperitoneal air is seen.  No opaque calculi
are noted.
IMPRESSION: 1.  Very dilated loops of small bowel with air-fluid levels most
consistent with small bowel obstruction.
2.  No active lung disease.

## 2013-05-05 IMAGING — CT CT ABD-PELV W/ CM
2 of 6 series · 16 of 46 positions shown, 18 images · IV contrast ([ID] OMNI [id number])
Comparison: Abdominal radiograph performed 07/19/2011

CLINICAL DATA: Abdominal pain, nausea and vomiting.

CT ABDOMEN AND PELVIS WITH CONTRAST
TECHNIQUE: Multidetector CT imaging of the abdomen and pelvis was
performed following the standard protocol during bolus
administration of intravenous contrast.
Contrast:  100 mL of Omnipaque 300 IV contrast

[Series 2: rtn a/p with · axial · 0.71mm/px · z∈[-520,-110]mm · 13 of 94 slices shown, 15 images]
[im 6/94  soft-tissue]
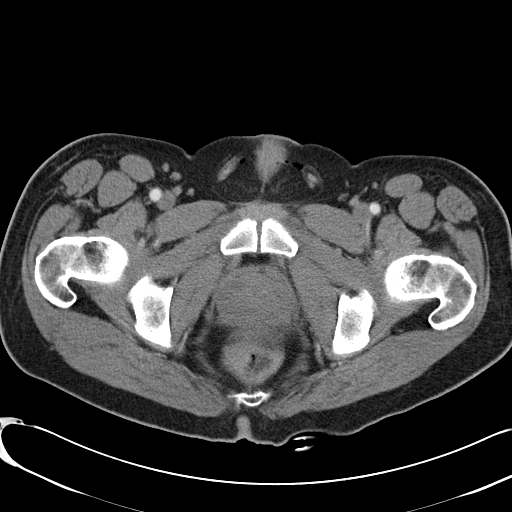
[im 6/94  bone]
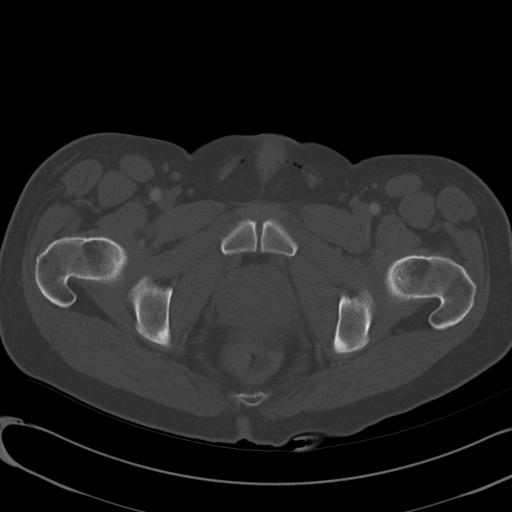
[im 11/94  soft-tissue]
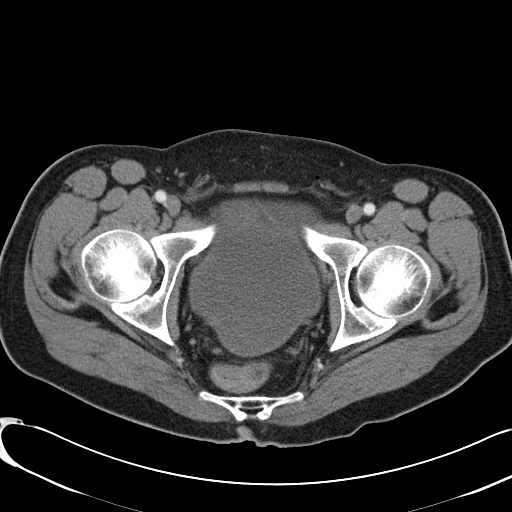
[im 22/94  soft-tissue]
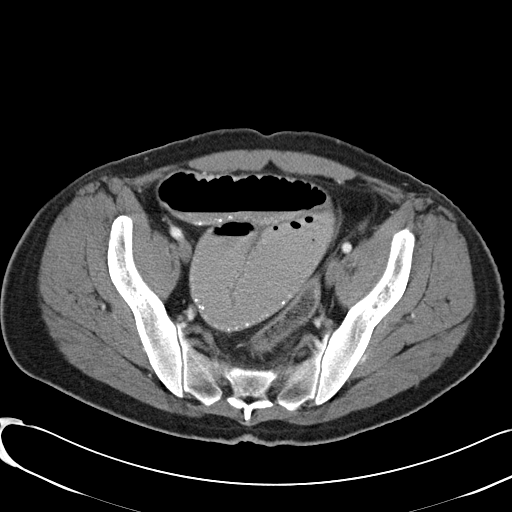
[im 28/94  soft-tissue]
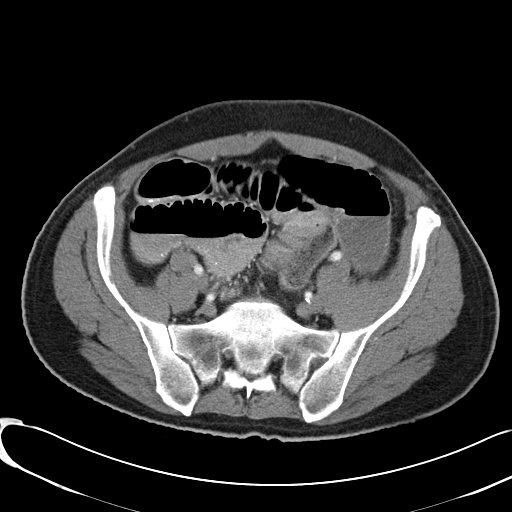
[im 33/94  soft-tissue]
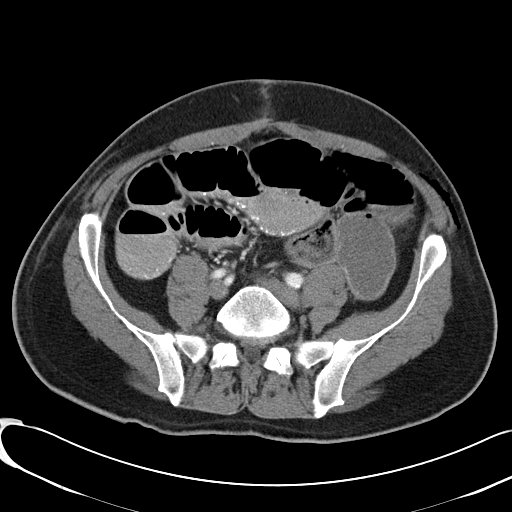
[im 39/94  soft-tissue]
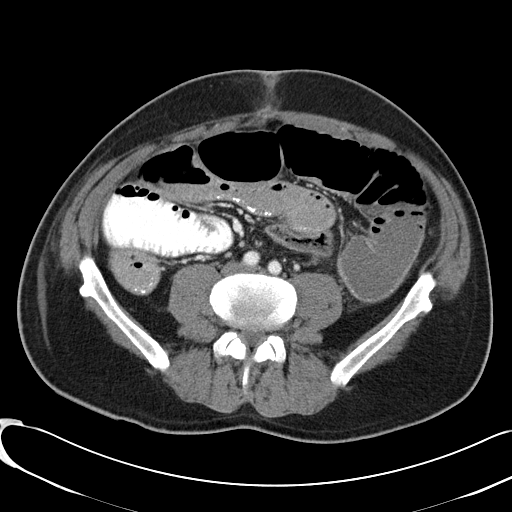
[im 50/94  soft-tissue]
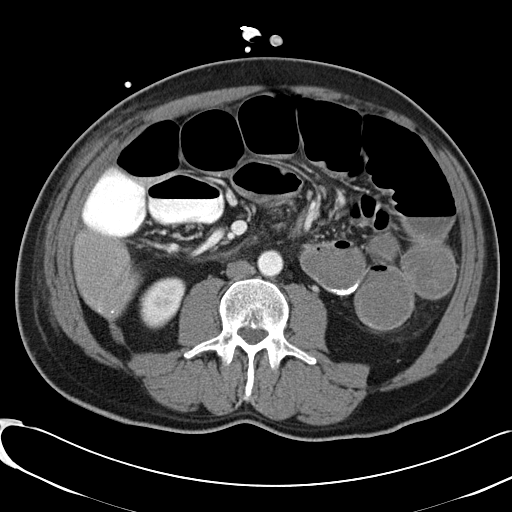
[im 55/94  soft-tissue]
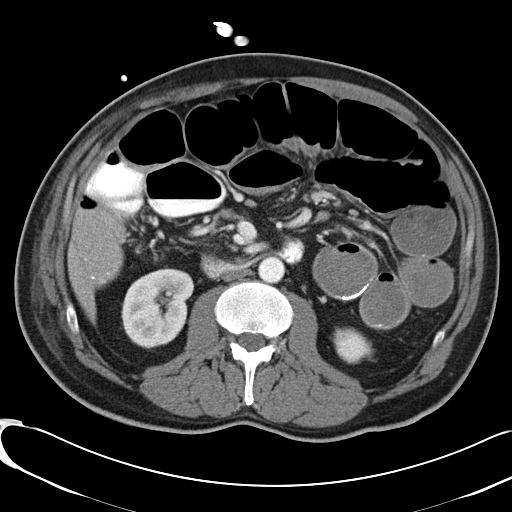
[im 61/94  soft-tissue]
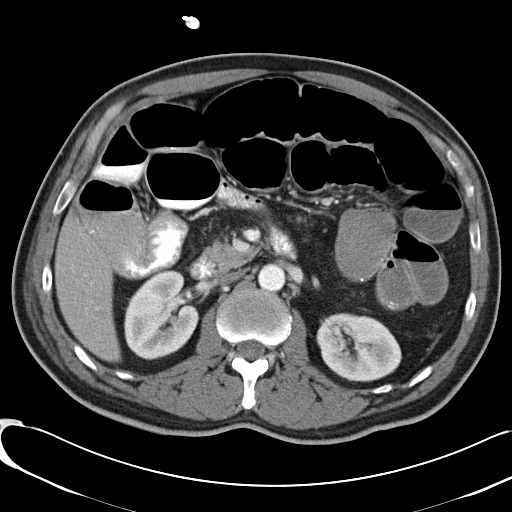
[im 61/94  bone]
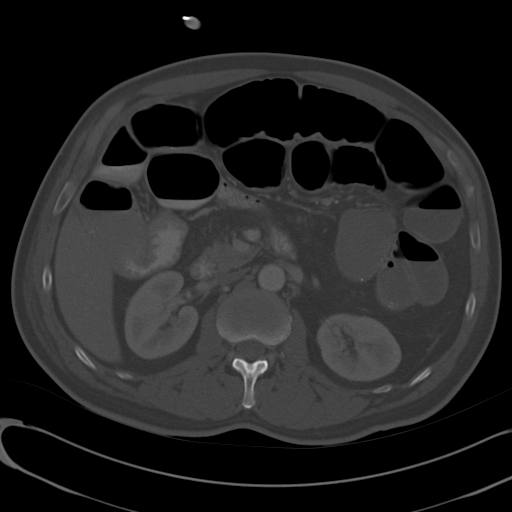
[im 66/94  soft-tissue]
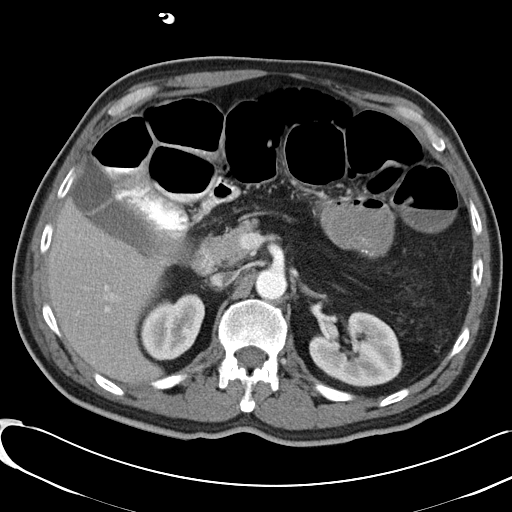
[im 72/94  soft-tissue]
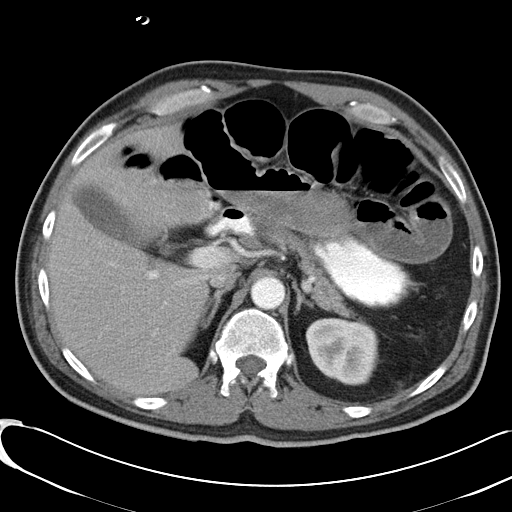
[im 83/94  soft-tissue]
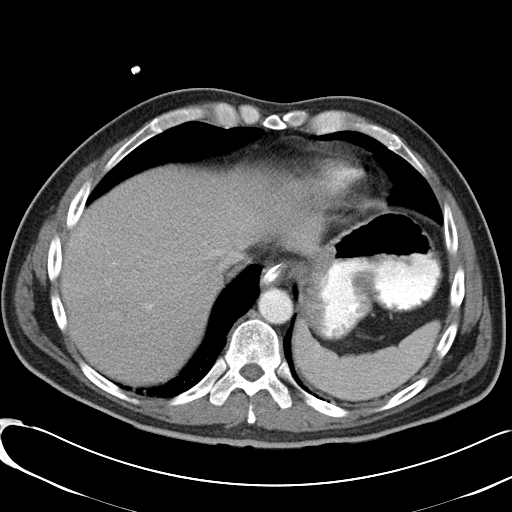
[im 88/94  soft-tissue]
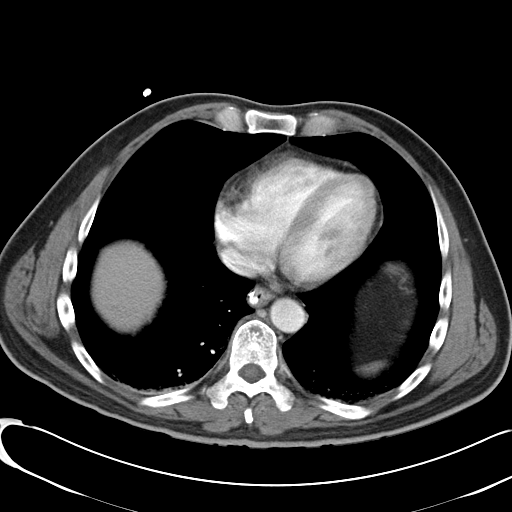

[Series 602: <mpr thick range> · coronal · 0.92mm/px · 3 of 84 slices shown]
[im 28/84  soft-tissue]
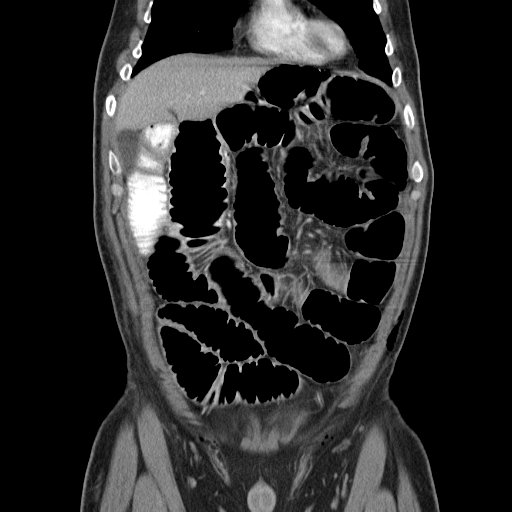
[im 37/84  soft-tissue]
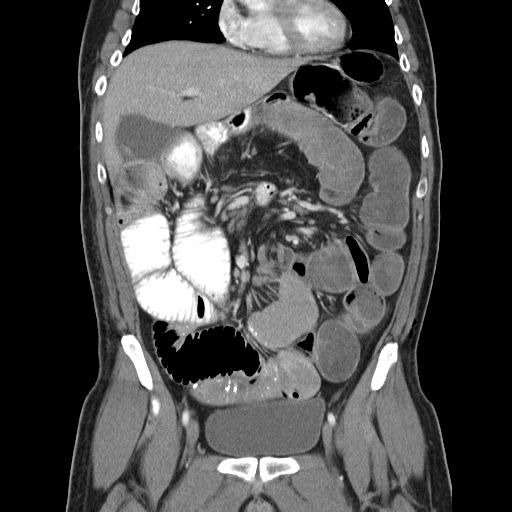
[im 47/84  soft-tissue]
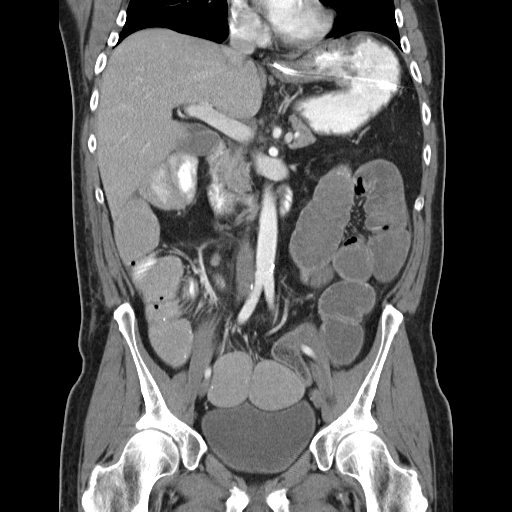

[16 of 46 positions shown; findings below may reference images not displayed]

FINDINGS: Minimal bibasilar atelectasis is noted.

The liver and spleen are unremarkable in appearance.  The
gallbladder is within normal limits.  The pancreas and adrenal
glands are unremarkable.

Minimal nonspecific stranding noted bilaterally.  The kidneys are
otherwise unremarkable in appearance.  There is no evidence of
hydronephrosis.  No renal or ureteral stones are seen.

The patient appears status post total colectomy, with a non-
visualized distal ileal - distal sigmoid anastomosis.  Along the
distal ileum, perhaps 10 cm proximal to the expected location of
the distal anastomosis, there is a focal volvulus measuring
approximately 270 degrees, with a whirlpool sign noted at the mid
abdomen just above the abdominal aorta.  The whirlpool sign
involves the bowel itself rather than the mesenteric vasculature;
the underlying mesentery is not well assessed.

There is diffuse distension of the more proximal small bowel,
partially filled with fluid and air, measuring up to 4.3 cm in
diameter, compatible with bowel obstruction.  There is no evidence
of perforation at this time.  No free fluid is seen.

The patient's enteric tube is noted ending at the body of the
stomach.  A small amount of contrast is noted within the stomach;
the stomach is grossly unremarkable.  No acute vascular
abnormalities are seen.  Scattered calcification is noted along the
distal abdominal aorta and its branches.

Postoperative change is noted along the anterior abdominal wall,
with minimal soft tissue air noted at the level of the anterior
pelvis, tracking along the inguinal canals bilaterally.

The bladder is moderately distended and grossly unremarkable in
appearance.  The prostate is enlarged, measuring 5.2 cm in
transverse dimension.  No inguinal lymphadenopathy is seen.

No acute osseous abnormalities are identified.
IMPRESSION: 1.  Small bowel obstruction noted, with diffuse distension of the
small bowel to 4.3 cm in diameter.  No evidence of perforation.  No
free fluid seen.  This appears to reflect a focal volvulus along
the distal ileum, perhaps 10 cm proximal to the expected location
of the non-visualized distal ileocolic anastomosis above the
rectum; the volvulus measures approximately 270 degrees, with a
whirlpool sign involving the bowel.
2.  Scattered calcification along the distal abdominal aorta and
its branches.
3.  Enlarged prostate.

These results were called by telephone on 07/20/2011  at  [DATE]
a.m. to  Nursing on [HOSPITAL] 5W, who verbally acknowledged these
results.

## 2013-09-18 ENCOUNTER — Telehealth (INDEPENDENT_AMBULATORY_CARE_PROVIDER_SITE_OTHER): Payer: Self-pay

## 2013-09-18 NOTE — Telephone Encounter (Signed)
Pt wife calling asking if copy of CT from 2014 can be faxed. Report faxed to [phone]

## 2014-03-16 ENCOUNTER — Encounter: Payer: Self-pay | Admitting: *Deleted
